# Patient Record
Sex: Female | Born: 1965 | Race: White | Hispanic: Refuse to answer | Marital: Married | Smoking: Never smoker
Health system: Southern US, Community
[De-identification: ages and names within clinical notes are randomized; demographics above are authoritative.]

## PROBLEM LIST (undated history)

## (undated) DIAGNOSIS — E039 Hypothyroidism, unspecified: Secondary | ICD-10-CM

## (undated) DIAGNOSIS — Z8619 Personal history of other infectious and parasitic diseases: Secondary | ICD-10-CM

## (undated) DIAGNOSIS — M5030 Other cervical disc degeneration, unspecified cervical region: Principal | ICD-10-CM

## (undated) HISTORY — DX: Personal history of other infectious and parasitic diseases: Z86.19

## (undated) HISTORY — DX: Other cervical disc degeneration, unspecified cervical region: M50.30

---

## 1987-05-23 HISTORY — PX: TEMPOROMANDIBULAR JOINT SURGERY: SHX35

## 1997-08-26 ENCOUNTER — Other Ambulatory Visit: Admission: RE | Admit: 1997-08-26 | Discharge: 1997-08-26 | Payer: Self-pay | Admitting: Obstetrics and Gynecology

## 1997-10-14 ENCOUNTER — Ambulatory Visit (HOSPITAL_COMMUNITY): Admission: RE | Admit: 1997-10-14 | Discharge: 1997-10-14 | Payer: Self-pay | Admitting: Obstetrics and Gynecology

## 1997-10-16 ENCOUNTER — Encounter: Admission: RE | Admit: 1997-10-16 | Discharge: 1998-01-14 | Payer: Self-pay | Admitting: Obstetrics & Gynecology

## 1998-01-17 ENCOUNTER — Inpatient Hospital Stay (HOSPITAL_COMMUNITY): Admission: AD | Admit: 1998-01-17 | Discharge: 1998-01-19 | Payer: Self-pay | Admitting: Obstetrics and Gynecology

## 1998-02-18 ENCOUNTER — Other Ambulatory Visit: Admission: RE | Admit: 1998-02-18 | Discharge: 1998-02-18 | Payer: Self-pay | Admitting: Obstetrics & Gynecology

## 1999-06-08 ENCOUNTER — Other Ambulatory Visit: Admission: RE | Admit: 1999-06-08 | Discharge: 1999-06-08 | Payer: Self-pay | Admitting: Obstetrics & Gynecology

## 1999-06-11 ENCOUNTER — Emergency Department (HOSPITAL_COMMUNITY): Admission: EM | Admit: 1999-06-11 | Discharge: 1999-06-11 | Payer: Self-pay | Admitting: Emergency Medicine

## 2000-11-15 ENCOUNTER — Other Ambulatory Visit: Admission: RE | Admit: 2000-11-15 | Discharge: 2000-11-15 | Payer: Self-pay | Admitting: Obstetrics & Gynecology

## 2001-11-27 ENCOUNTER — Other Ambulatory Visit: Admission: RE | Admit: 2001-11-27 | Discharge: 2001-11-27 | Payer: Self-pay | Admitting: Obstetrics & Gynecology

## 2002-08-14 ENCOUNTER — Encounter: Payer: Self-pay | Admitting: Obstetrics & Gynecology

## 2002-08-14 ENCOUNTER — Encounter: Admission: RE | Admit: 2002-08-14 | Discharge: 2002-08-14 | Payer: Self-pay | Admitting: Obstetrics & Gynecology

## 2002-12-02 ENCOUNTER — Other Ambulatory Visit: Admission: RE | Admit: 2002-12-02 | Discharge: 2002-12-02 | Payer: Self-pay | Admitting: Obstetrics & Gynecology

## 2004-02-15 ENCOUNTER — Other Ambulatory Visit: Admission: RE | Admit: 2004-02-15 | Discharge: 2004-02-15 | Payer: Self-pay | Admitting: Obstetrics & Gynecology

## 2005-03-22 ENCOUNTER — Other Ambulatory Visit: Admission: RE | Admit: 2005-03-22 | Discharge: 2005-03-22 | Payer: Self-pay | Admitting: Obstetrics & Gynecology

## 2005-06-18 ENCOUNTER — Emergency Department: Payer: Self-pay | Admitting: Emergency Medicine

## 2008-01-19 ENCOUNTER — Emergency Department: Payer: Self-pay | Admitting: Internal Medicine

## 2008-08-04 ENCOUNTER — Encounter: Admission: RE | Admit: 2008-08-04 | Discharge: 2008-08-04 | Payer: Self-pay | Admitting: Sports Medicine

## 2008-08-26 ENCOUNTER — Encounter: Admission: RE | Admit: 2008-08-26 | Discharge: 2008-08-26 | Payer: Self-pay | Admitting: Sports Medicine

## 2009-12-31 LAB — CBC AND DIFFERENTIAL
HCT: 44 % (ref 36–46)
Hemoglobin: 15.7 g/dL (ref 12.0–16.0)
Platelets: 202 10*3/uL (ref 150–399)
WBC: 4.2 10^3/mL

## 2009-12-31 LAB — BASIC METABOLIC PANEL
BUN: 10 mg/dL (ref 4–21)
Creatinine: 0.8 mg/dL (ref 0.5–1.1)
GLUCOSE: 90 mg/dL
POTASSIUM: 4.7 mmol/L (ref 3.4–5.3)
Sodium: 141 mmol/L (ref 137–147)

## 2009-12-31 LAB — HEPATIC FUNCTION PANEL
ALT: 11 U/L (ref 7–35)
AST: 19 U/L (ref 13–35)

## 2010-01-03 ENCOUNTER — Ambulatory Visit: Payer: Self-pay | Admitting: Family Medicine

## 2010-06-10 ENCOUNTER — Encounter
Admission: RE | Admit: 2010-06-10 | Discharge: 2010-06-10 | Payer: Self-pay | Source: Home / Self Care | Attending: Specialist | Admitting: Specialist

## 2013-07-16 ENCOUNTER — Emergency Department: Payer: Self-pay | Admitting: Emergency Medicine

## 2013-07-16 LAB — COMPREHENSIVE METABOLIC PANEL
ALK PHOS: 64 U/L
ALT: 21 U/L (ref 12–78)
Albumin: 4.4 g/dL (ref 3.4–5.0)
Anion Gap: 5 — ABNORMAL LOW (ref 7–16)
BILIRUBIN TOTAL: 0.8 mg/dL (ref 0.2–1.0)
BUN: 12 mg/dL (ref 7–18)
CALCIUM: 9.1 mg/dL (ref 8.5–10.1)
Chloride: 105 mmol/L (ref 98–107)
Co2: 26 mmol/L (ref 21–32)
Creatinine: 0.96 mg/dL (ref 0.60–1.30)
EGFR (African American): >60
Glucose: 105 mg/dL — ABNORMAL HIGH (ref 65–99)
OSMOLALITY: 272 (ref 275–301)
Potassium: 4 mmol/L (ref 3.5–5.1)
SGOT(AST): 26 U/L (ref 15–37)
Sodium: 136 mmol/L (ref 136–145)
Total Protein: 7.8 g/dL (ref 6.4–8.2)

## 2013-07-16 LAB — CBC WITH DIFFERENTIAL/PLATELET
Basophil #: 0 10*3/uL (ref 0.0–0.1)
Basophil %: 0.4 %
Eosinophil #: 0 10*3/uL (ref 0.0–0.7)
Eosinophil %: 0.1 %
HCT: 47.3 % — ABNORMAL HIGH (ref 35.0–47.0)
HGB: 16.3 g/dL — ABNORMAL HIGH (ref 12.0–16.0)
LYMPHS PCT: 2.8 %
Lymphocyte #: 0.2 10*3/uL — ABNORMAL LOW (ref 1.0–3.6)
MCH: 33.3 pg (ref 26.0–34.0)
MCHC: 34.5 g/dL (ref 32.0–36.0)
MCV: 96 fL (ref 80–100)
Monocyte #: 0.3 x10 3/mm (ref 0.2–0.9)
Monocyte %: 3.2 %
Neutrophil #: 8.2 10*3/uL — ABNORMAL HIGH (ref 1.4–6.5)
Neutrophil %: 93.5 %
Platelet: 146 10*3/uL — ABNORMAL LOW (ref 150–440)
RBC: 4.91 10*6/uL (ref 3.80–5.20)
RDW: 12 % (ref 11.5–14.5)
WBC: 8.8 10*3/uL (ref 3.6–11.0)

## 2014-01-20 DIAGNOSIS — M5030 Other cervical disc degeneration, unspecified cervical region: Secondary | ICD-10-CM

## 2014-01-20 HISTORY — DX: Other cervical disc degeneration, unspecified cervical region: M50.30

## 2014-02-16 ENCOUNTER — Ambulatory Visit: Payer: Self-pay | Admitting: Family Medicine

## 2014-02-17 ENCOUNTER — Ambulatory Visit
Admission: RE | Admit: 2014-02-17 | Discharge: 2014-02-17 | Disposition: A | Discharge status: Home / Self Care | Payer: BC Managed Care – PPO | Source: Ambulatory Visit | Attending: Family Medicine | Admitting: Family Medicine

## 2014-02-17 ENCOUNTER — Other Ambulatory Visit: Payer: Self-pay | Admitting: Family Medicine

## 2014-02-17 DIAGNOSIS — R29898 Other symptoms and signs involving the musculoskeletal system: Secondary | ICD-10-CM

## 2014-02-17 DIAGNOSIS — M542 Cervicalgia: Secondary | ICD-10-CM

## 2014-10-30 ENCOUNTER — Ambulatory Visit (INDEPENDENT_AMBULATORY_CARE_PROVIDER_SITE_OTHER): Payer: BC Managed Care – PPO | Admitting: Family Medicine

## 2014-10-30 ENCOUNTER — Encounter: Payer: Self-pay | Admitting: Family Medicine

## 2014-10-30 VITALS — BP 116/80 | HR 71 | Temp 99.0°F | Resp 16 | Ht 65.5 in | Wt 155.0 lb

## 2014-10-30 DIAGNOSIS — M542 Cervicalgia: Secondary | ICD-10-CM

## 2014-10-30 DIAGNOSIS — M5030 Other cervical disc degeneration, unspecified cervical region: Secondary | ICD-10-CM | POA: Insufficient documentation

## 2014-10-30 DIAGNOSIS — R202 Paresthesia of skin: Secondary | ICD-10-CM | POA: Insufficient documentation

## 2014-10-30 MED ORDER — PREDNISONE 10 MG PO TABS: ORAL_TABLET | ORAL | Status: AC

## 2014-10-30 NOTE — Progress Notes (Signed)
   Patient: Brittany Velazquez Female    DOB: 04/30/1966   49 y.o.   MRN: [id number] Visit Date: 10/30/2014  Today's Provider: Mila Merry, MD   Chief Complaint  Patient presents with  . Neck Pain   Subjective:    Neck Pain  This is a recurrent problem. The current episode started yesterday. The problem occurs constantly. The problem has been rapidly improving. The pain is present in the midline. The quality of the pain is described as aching. The pain is at a severity of 5/10. The pain is moderate. The symptoms are aggravated by bending (turning head side to side). Associated symptoms include headaches, numbness (in fingers on right hand), photophobia, tingling and weakness (in right arm). Pertinent negatives include no chest pain, fever, leg pain, pain with swallowing, syncope, trouble swallowing or visual change. Treatments tried: has been taking Prednisone that was prescribed seveal months ago. The treatment provided mild relief.    Patient states she has a history of ruptured disc in her neck which responded very well to prednisone in the past. She did see Dr. Wynetta Emery last year after similar episode and states no further intervention was recommended since she improved so much with prednisone. She states she took 12 prednisone pills yesterday feels much better today, and denies any difficulty with feeling nervous, jittery, or any trouble with her heart racing.   Previous Medications   OMEPRAZOLE (PRILOSEC) 20 MG CAPSULE    Take 20 mg by mouth daily.    Review of Systems  Constitutional: Negative for fever.  HENT: Negative for trouble swallowing.   Eyes: Positive for photophobia.  Cardiovascular: Negative for chest pain and syncope.  Musculoskeletal: Positive for neck pain.  Neurological: Positive for tingling, weakness (in right arm), numbness (in fingers on right hand) and headaches.    History  Substance Use Topics  . Smoking status: Never Smoker   . Smokeless tobacco: Not on file  .  Alcohol Use: No   Objective:   BP 116/80 mmHg  Pulse 71  Temp(Src) 99 F (37.2 C) (Oral)  Resp 16  Ht 5' 5.5" (1.664 m)  Wt 155 lb (70.308 kg)  BMI 25.39 kg/m2  SpO2 97%  Physical Exam   General Appearance:    Alert, cooperative, no distress  Eyes:    PERRL, conjunctiva/corneas clear, EOM's intact       Lungs:     Clear to auscultation bilaterally, respirations unlabored  Heart:    Regular rate and rhythm  Neurologic:   Awake, alert, oriented x 3. No apparent focal neurological           defect.   MS:   Minimal tenderness over mid c-spine. Normal strength and sensation of both upper extremities. Lateral head rotation limited to about 20 degrees to the left and about 45 degrees up due to pain.         Assessment & Plan:     1. Neck pain Known cervical disk disease. Responded well to prednisone in the past and will have her taper for the next 12 days. Call if not quickly improving.  - predniSONE (DELTASONE) 10 MG tablet; 6 tablets for 2 days, then 5 for 2 days, then 4 for 2 days, then 3 for 2 days, then 2 for 2 days, then 1 for 2 days.  Dispense: 42 tablet; Refill: 1   Follow up: No Follow-up on file.

## 2014-11-05 ENCOUNTER — Encounter: Payer: Self-pay | Admitting: Family Medicine

## 2015-10-12 ENCOUNTER — Encounter: Payer: Self-pay | Admitting: Family Medicine

## 2015-10-12 ENCOUNTER — Ambulatory Visit
Admission: RE | Admit: 2015-10-12 | Discharge: 2015-10-12 | Disposition: A | Discharge status: Home / Self Care | Payer: BC Managed Care – PPO | Source: Ambulatory Visit | Attending: Family Medicine | Admitting: Family Medicine

## 2015-10-12 ENCOUNTER — Ambulatory Visit (INDEPENDENT_AMBULATORY_CARE_PROVIDER_SITE_OTHER): Payer: BC Managed Care – PPO | Admitting: Family Medicine

## 2015-10-12 VITALS — BP 112/78 | HR 66 | Resp 18 | Wt 155.0 lb

## 2015-10-12 DIAGNOSIS — M5441 Lumbago with sciatica, right side: Secondary | ICD-10-CM | POA: Diagnosis present

## 2015-10-12 MED ORDER — METHOCARBAMOL 500 MG PO TABS: 500.0000 mg | ORAL_TABLET | Freq: Four times a day (QID) | ORAL | Status: DC

## 2015-10-12 MED ORDER — PREDNISONE 5 MG PO TABS: 5.0000 mg | ORAL_TABLET | Freq: Every day | ORAL | Status: DC

## 2015-10-12 NOTE — Progress Notes (Signed)
Patient ID: Brittany Velazquez, female   DOB: 1965-09-22, 50 y.o.   MRN: [id number]       Patient: Brittany Velazquez Female    DOB: 1966-01-25   50 y.o.   MRN: [id number] Visit Date: 10/12/2015  Today's Provider: Dortha Kern, PA   Chief Complaint  Patient presents with  . Back Pain   Subjective:    HPI  Patient started to have lumbar pain-disk issue on Sunday May 21st. This occur after she stepped over a fence and then rode a horse. Pain was very severe yesterday morning, she has hard time laying down, sitting down or walking. Standing is the most comfortable position. Patient is having pain radiating to the right leg also. She states that she has reccurring pain in disk area in between shoulder blades usually, she responds to Prednisone well and was in last time for this in June 2016 and saw Dr. Sherrie Mustache. She has had MRI cervical spine and xray in 2015.   Past Medical History  Diagnosis Date  . Annular tear of cervical disc 01/2014    Referred to Dr. Wynetta Emery 03/03/2014  . History of chicken pox   . History of mumps    Past Surgical History  Procedure Laterality Date  . Temporomandibular joint surgery  1989   Family History  Problem Relation Age of Onset  . Prostate cancer Father   . Breast cancer Sister    Allergies  Allergen Reactions  . Fentanyl Shortness Of Breath  . Codeine Rash   Previous Medications   LEVOTHYROXINE (SYNTHROID, LEVOTHROID) 50 MCG TABLET    Take 50 mcg by mouth daily.    Review of Systems  Constitutional: Negative.   Respiratory: Negative.   Cardiovascular: Negative.   Musculoskeletal: Positive for back pain, arthralgias and gait problem.    Social History  Substance Use Topics  . Smoking status: Never Smoker   . Smokeless tobacco: Never Used  . Alcohol Use: No   Objective:   BP 112/78 mmHg  Pulse 66  Resp 18  Wt 155 lb (70.308 kg)  Physical Exam  Constitutional: She is oriented to person, place, and time. She appears well-developed and  well-nourished. No distress.  HENT:  Head: Normocephalic and atraumatic.  Right Ear: Hearing normal.  Left Ear: Hearing normal.  Nose: Nose normal.  Eyes: Conjunctivae and lids are normal. Right eye exhibits no discharge. Left eye exhibits no discharge. No scleral icterus.  Cardiovascular: Normal rate and regular rhythm.   Pulmonary/Chest: Effort normal and breath sounds normal. No respiratory distress.  Abdominal: Soft. Bowel sounds are normal.  Musculoskeletal: She exhibits tenderness.  Pain to palpate dorsal spine of 1 or 2 lower lumbar vertebrae. Decreased strength in right leg to extend or dorsiflex toes/ankle. Right SLR's 80 degrees with pain in the right lower back.- no pain to test left leg.  Neurological: She is alert and oriented to person, place, and time.  DTR's symmetric knee and ankle jerk.  Skin: Skin is intact. No lesion and no rash noted.  Psychiatric: She has a normal mood and affect. Her speech is normal and behavior is normal. Thought content normal.      Assessment & Plan:     1. Midline low back pain with right-sided sciatica Some weakness in the right leg with sharp pains in lower lumbar vertebrae with certain movements (up to 8.5/10 pain level). Less pain standing. No numbness today. No known injury. Stepped over a 2 foot fence (twisting) and pain started 2  days ago. Given Prednisone for inflammation and Robaxin for pain and spasm. Will get x-ray evaluation (LMP 1 week ago - husband had a vasectomy). Home to rest and use ice pack or moist heat. Recheck pending x-ray reports. May need physical therapy and/or MRI scan. - predniSONE (DELTASONE) 5 MG tablet; Take 1 tablet (5 mg total) by mouth daily with breakfast. Taper by one tablet daily until all taken (6,5,4,3,2,1).  Dispense: 21 tablet; Refill: 0 - methocarbamol (ROBAXIN) 500 MG tablet; Take 1 tablet (500 mg total) by mouth 4 (four) times daily.  Dispense: 40 tablet; Refill: 0 - DG Lumbar Spine Complete        Dortha Kernennis Chrismon, PA  [facility]

## 2015-10-12 NOTE — Patient Instructions (Signed)

## 2015-11-16 LAB — HM PAP SMEAR

## 2015-11-30 ENCOUNTER — Encounter: Payer: Self-pay | Admitting: Family Medicine

## 2016-04-06 ENCOUNTER — Encounter: Payer: Self-pay | Admitting: Family Medicine

## 2016-04-06 ENCOUNTER — Ambulatory Visit (INDEPENDENT_AMBULATORY_CARE_PROVIDER_SITE_OTHER): Payer: BC Managed Care – PPO | Admitting: Family Medicine

## 2016-04-06 VITALS — BP 104/70 | HR 61 | Temp 98.4°F | Resp 16 | Wt 155.0 lb

## 2016-04-06 DIAGNOSIS — M5441 Lumbago with sciatica, right side: Secondary | ICD-10-CM | POA: Diagnosis not present

## 2016-04-06 DIAGNOSIS — R590 Localized enlarged lymph nodes: Secondary | ICD-10-CM | POA: Diagnosis not present

## 2016-04-06 DIAGNOSIS — R110 Nausea: Secondary | ICD-10-CM

## 2016-04-06 MED ORDER — PREDNISONE 5 MG PO TABS: ORAL_TABLET | ORAL | 0 refills | Status: DC

## 2016-04-06 MED ORDER — ONDANSETRON 4 MG PO TBDP: 4.0000 mg | ORAL_TABLET | Freq: Three times a day (TID) | ORAL | 0 refills | Status: DC | PRN

## 2016-04-06 MED ORDER — DOXYCYCLINE HYCLATE 100 MG PO TABS: 100.0000 mg | ORAL_TABLET | Freq: Two times a day (BID) | ORAL | 0 refills | Status: AC

## 2016-04-06 NOTE — Progress Notes (Signed)
104/70       Patient: Brittany Velazquez Female    DOB: 11/20/1965   50 y.o.   MRN: [id number] Visit Date: 04/06/2016  Today's Provider: Mila Merryonald Fisher, MD   Chief Complaint  Patient presents with  . Adenopathy   Subjective:    Patient noticed 2 nodes under right arm 3 days ago 04/03/16. Patient stated that she has had cold symptoms last week. Sore throat, congestion, sinus pressure and simus drainage. Node are swollen and red.       Allergies  Allergen Reactions  . Fentanyl Shortness Of Breath  . Codeine Rash     Current Outpatient Prescriptions:  .  levothyroxine (SYNTHROID, LEVOTHROID) 50 MCG tablet, Take 50 mcg by mouth daily., Disp: , Rfl: 4 .  predniSONE (DELTASONE) 5 MG tablet, Take 1 tablet (5 mg total) by mouth daily with breakfast. Taper by one tablet daily until all taken (6,5,4,3,2,1)., Disp: 21 tablet, Rfl: 0  Review of Systems  Constitutional: Negative for appetite change, chills, fatigue and fever.  HENT: Positive for congestion, postnasal drip, rhinorrhea and sinus pressure.   Respiratory: Negative for chest tightness and shortness of breath.   Cardiovascular: Negative for chest pain and palpitations.  Gastrointestinal: Negative for abdominal pain, nausea and vomiting.  Neurological: Positive for headaches. Negative for dizziness and weakness.    Social History  Substance Use Topics  . Smoking status: Never Smoker  . Smokeless tobacco: Never Used  . Alcohol use No   Objective:   BP 104/70 (BP Location: Left Arm, Patient Position: Sitting, Cuff Size: Normal)   Pulse 61   Temp 98.4 F (36.9 C) (Oral)   Resp 16   Wt 155 lb (70.3 kg)   LMP 03/20/2016   SpO2 97%   BMI 25.40 kg/m   Physical Exam   General Appearance:    Alert, cooperative, no distress  Eyes:    PERRL, conjunctiva/corneas clear, EOM's intact       Lungs:     Clear to auscultation bilaterally, respirations unlabored  Heart:    Regular rate and rhythm  Neurologic:   Awake, alert,  oriented x 3. No apparent focal neurological           defect.   Lymph:   Several small follicular lesion and very tender slightly swollen right axillary lymph nodes. No erythema.        Assessment & Plan:     1. Lymphadenopathy, axillary  - doxycycline (VIBRA-TABS) 100 MG tablet; Take 1 tablet (100 mg total) by mouth 2 (two) times daily.  Dispense: 20 tablet; Refill: 0 Follow up to recheck in 10 days unless symptoms have completely resovled.   2. Midline low back pain with right-sided sciatica, unspecified chronicity She states occasional course of prednisone prescribed by previous PCP was very effective and requests refill today.  - predniSONE (DELTASONE) 5 MG tablet; Taper by one tablet daily until all taken (6,5,4,3,2,1).  Dispense: 21 tablet; Refill: 0  3. Nausea Has occasional bouts of nausea and has been prescribed ondansetron in the past which she states worked well without adverse effects. Requests refill today.  - ondansetron (ZOFRAN ODT) 4 MG disintegrating tablet; Take 1 tablet (4 mg total) by mouth every 8 (eight) hours as needed for nausea or vomiting.  Dispense: 20 tablet; Refill: 0     The entirety of the information documented in the History of Present Illness, Review of Systems and Physical Exam were personally obtained by me. Portions of this information were initially  documented by April M. Sabra Heck, CMA and reviewed by me for thoroughness and accuracy.    Lelon Huh, MD  [facility]

## 2017-02-27 LAB — HEPATIC FUNCTION PANEL
ALT: 15 (ref 7–35)
AST: 25 (ref 13–35)
Alkaline Phosphatase: 85 (ref 25–125)
Bilirubin, Total: 0.5

## 2017-02-27 LAB — COMPREHENSIVE METABOLIC PANEL
ALBUMIN: 4.7
BILIRUBIN TOTAL: 0.5
CALCIUM: 9.5
Carbon Dioxide, Total: 21
Chloride: 105
TOTAL PROTEIN: 7.3 g/dL

## 2017-02-27 LAB — HEMOGLOBIN A1C: Hemoglobin A1C: 5

## 2017-02-27 LAB — BASIC METABOLIC PANEL
BUN: 13 (ref 4–21)
Creatinine: 0.9 (ref 0.5–1.1)
GLUCOSE: 87
Sodium: 143 (ref 137–147)

## 2017-02-27 LAB — LIPID PANEL
Cholesterol: 216 — AB (ref 0–200)
HDL: 58 (ref 35–70)
LDL Cholesterol: 136
Triglycerides: 108 (ref 40–160)

## 2017-02-27 LAB — CBC AND DIFFERENTIAL
HCT: 41 (ref 36–46)
Hemoglobin: 14.8 (ref 12.0–16.0)
PLATELETS: 189 (ref 150–399)
WBC: 4.9

## 2017-02-27 LAB — TSH: TSH: 1.63 (ref 0.41–5.90)

## 2017-02-27 LAB — CBC: RBC: 4.44

## 2017-03-05 ENCOUNTER — Encounter: Payer: Self-pay | Admitting: *Deleted

## 2017-03-07 ENCOUNTER — Encounter: Payer: Self-pay | Admitting: Family Medicine

## 2017-03-07 ENCOUNTER — Ambulatory Visit (INDEPENDENT_AMBULATORY_CARE_PROVIDER_SITE_OTHER): Payer: BC Managed Care – PPO | Admitting: Family Medicine

## 2017-03-07 VITALS — BP 92/66 | HR 76 | Temp 98.1°F | Resp 16 | Wt 149.0 lb

## 2017-03-07 DIAGNOSIS — R0981 Nasal congestion: Secondary | ICD-10-CM

## 2017-03-07 DIAGNOSIS — H8110 Benign paroxysmal vertigo, unspecified ear: Secondary | ICD-10-CM

## 2017-03-07 DIAGNOSIS — Z23 Encounter for immunization: Secondary | ICD-10-CM

## 2017-03-07 MED ORDER — MECLIZINE HCL 32 MG PO TABS: 32.0000 mg | ORAL_TABLET | Freq: Three times a day (TID) | ORAL | 0 refills | Status: DC | PRN

## 2017-03-07 MED ORDER — FLUTICASONE PROPIONATE 50 MCG/ACT NA SUSP: 2.0000 | Freq: Every day | NASAL | 6 refills | Status: DC

## 2017-03-07 MED ORDER — MECLIZINE HCL 25 MG PO TABS
25.0000 mg | ORAL_TABLET | Freq: Three times a day (TID) | ORAL | 0 refills | Status: DC | PRN
Start: 2017-03-07 — End: 2017-06-11

## 2017-03-07 NOTE — Progress Notes (Signed)
Patient: Brittany Velazquez Female    DOB: 12/10/1965   51 y.o.   MRN: [id number] Visit Date: 03/07/2017  Today's Provider: Shirlee LatchAngela Bacigalupo, MD   Chief Complaint  Patient presents with  . Dizziness   Subjective:    Dizziness  This is a new problem. Episode onset: x 3 days. The problem has been gradually worsening. Associated symptoms include anorexia, congestion, headaches (and facial pain), nausea (improved with Zofran), neck pain, vertigo and vomiting (improved with Zofran). Pertinent negatives include no abdominal pain, arthralgias, change in bowel habit, chest pain, chills, coughing, diaphoresis, fatigue, fever, myalgias, numbness, sore throat, swollen glands, urinary symptoms, visual change or weakness. Exacerbated by: any movement; even moving eyes exacerbates room spinning sensation. Treatments tried: Zofran, dramamine. Improvement on treatment: improves N/V, but not dizziness.  Pt states she had a cold last week, and feels as if the sinus issues is causing vertigo. She also states her brother was just diagnosed with MS, and had similar sx including neck pain and vertigo. Pt states she mentioned this to her GYN last week, who referred pt to a neurologist for further evaluation.  They have called to schedule an appt already.  Worse with looking upward and laying down.  Feels pulled to the right side.  States congestion is not similar to previous sinus infections.  Present for 6 days.  No cough.  HA, runny nose, nasal congestion, dizziness associated with N/V.  No acute visual changes.      Allergies  Allergen Reactions  . Fentanyl Shortness Of Breath  . Codeine Rash     Current Outpatient Prescriptions:  .  levothyroxine (SYNTHROID, LEVOTHROID) 50 MCG tablet, Take 50 mcg by mouth daily., Disp: , Rfl: 4 .  fluticasone (FLONASE) 50 MCG/ACT nasal spray, Place 2 sprays into both nostrils daily., Disp: 16 g, Rfl: 6 .  meclizine (ANTIVERT) 25 MG tablet, Take 1 tablet (25 mg  total) by mouth 3 (three) times daily as needed for dizziness., Disp: 90 tablet, Rfl: 0 .  ondansetron (ZOFRAN ODT) 4 MG disintegrating tablet, Take 1 tablet (4 mg total) by mouth every 8 (eight) hours as needed for nausea or vomiting. (Patient not taking: Reported on 03/07/2017), Disp: 20 tablet, Rfl: 0  Review of Systems  Constitutional: Negative for chills, diaphoresis, fatigue and fever.  HENT: Positive for congestion. Negative for sore throat.   Respiratory: Negative for cough.   Cardiovascular: Negative for chest pain.  Gastrointestinal: Positive for anorexia, nausea (improved with Zofran) and vomiting (improved with Zofran). Negative for abdominal pain and change in bowel habit.  Musculoskeletal: Positive for neck pain. Negative for arthralgias and myalgias.  Neurological: Positive for dizziness, vertigo and headaches (and facial pain). Negative for weakness and numbness.    Social History  Substance Use Topics  . Smoking status: Never Smoker  . Smokeless tobacco: Never Used  . Alcohol use No   Objective:   BP 92/66 (BP Location: Left Arm, Patient Position: Sitting, Cuff Size: Normal)   Pulse 76   Temp 98.1 F (36.7 C) (Oral)   Resp 16   Wt 149 lb (67.6 kg)   BMI 24.42 kg/m  Vitals:   03/07/17 0831  BP: 92/66  Pulse: 76  Resp: 16  Temp: 98.1 F (36.7 C)  TempSrc: Oral  Weight: 149 lb (67.6 kg)     Physical Exam  Constitutional: She is oriented to person, place, and time. She appears well-developed and well-nourished. No distress.  HENT:  Head:  Normocephalic and atraumatic.  Right Ear: Hearing, tympanic membrane, external ear and ear canal normal.  Left Ear: Hearing, tympanic membrane, external ear and ear canal normal.  Nose: Nose normal. Right sinus exhibits no maxillary sinus tenderness and no frontal sinus tenderness. Left sinus exhibits no maxillary sinus tenderness and no frontal sinus tenderness.  Mouth/Throat: Uvula is midline, oropharynx is clear and  moist and mucous membranes are normal.  Eyes: Pupils are equal, round, and reactive to light. Conjunctivae are normal. No scleral icterus. Right eye exhibits nystagmus. Right eye exhibits normal extraocular motion. Left eye exhibits nystagmus. Left eye exhibits normal extraocular motion.  Neck: Neck supple. No thyromegaly present.  Cardiovascular: Normal rate, regular rhythm and normal heart sounds.   No murmur heard. Pulmonary/Chest: Effort normal and breath sounds normal. No respiratory distress. She has no wheezes. She has no rales.  Abdominal: Soft. She exhibits no distension. There is no tenderness.  Musculoskeletal: She exhibits no edema or deformity.  Lymphadenopathy:    She has no cervical adenopathy.  Neurological: She is alert and oriented to person, place, and time. She has normal strength and normal reflexes. She displays no tremor. No cranial nerve deficit or sensory deficit. She exhibits normal muscle tone.  RAM intact, FNF intact, negative pronator drift, slow, calculated gait  Skin: Skin is warm and dry. No rash noted.  Psychiatric: She has a normal mood and affect. Her behavior is normal.  Vitals reviewed.      Assessment & Plan:     1. Benign paroxysmal positional vertigo, unspecified laterality - likely related to sinus congestion, neuro intact - trial of meclizine - no indication for imaging at this time - discussed return precautions - including change in neuro status, persistent vertigo not helped by meclizine, etc  2. Sinus congestion - chronic intermittent problem for patient - trial of flonase - this may be worsening BPPV  3. Need for immunization against influenza - Flu Vaccine QUAD 36+ mos IM   Meds ordered this encounter  Medications  . DISCONTD: meclizine (ANTIVERT) 32 MG tablet    Sig: Take 1 tablet (32 mg total) by mouth 3 (three) times daily as needed.    Dispense:  90 tablet    Refill:  0  . fluticasone (FLONASE) 50 MCG/ACT nasal spray    Sig:  Place 2 sprays into both nostrils daily.    Dispense:  16 g    Refill:  6  . meclizine (ANTIVERT) 25 MG tablet    Sig: Take 1 tablet (25 mg total) by mouth 3 (three) times daily as needed for dizziness.    Dispense:  90 tablet    Refill:  0    Order Specific Question:   Supervising Provider    Answer:   Malva Limes [[id number]]    Return if symptoms worsen or fail to improve.     The entirety of the information documented in the History of Present Illness, Review of Systems and Physical Exam were personally obtained by me. Portions of this information were initially documented by Irving Burton Ratchford, CMA and reviewed by me for thoroughness and accuracy.     Shirlee Latch, MD  [facility]

## 2017-03-07 NOTE — Patient Instructions (Signed)
Nystagmus - eye movement  Benign Positional Vertigo Vertigo is the feeling that you or your surroundings are moving when they are not. Benign positional vertigo is the most common form of vertigo. The cause of this condition is not serious (is benign). This condition is triggered by certain movements and positions (is positional). This condition can be dangerous if it occurs while you are doing something that could endanger you or others, such as driving. What are the causes? In many cases, the cause of this condition is not known. It may be caused by a disturbance in an area of the inner ear that helps your brain to sense movement and balance. This disturbance can be caused by a viral infection (labyrinthitis), head injury, or repetitive motion. What increases the risk? This condition is more likely to develop in:  Women.  People who are 52 years of age or older.  What are the signs or symptoms? Symptoms of this condition usually happen when you move your head or your eyes in different directions. Symptoms may start suddenly, and they usually last for less than a minute. Symptoms may include:  Loss of balance and falling.  Feeling like you are spinning or moving.  Feeling like your surroundings are spinning or moving.  Nausea and vomiting.  Blurred vision.  Dizziness.  Involuntary eye movement (nystagmus).  Symptoms can be mild and cause only slight annoyance, or they can be severe and interfere with daily life. Episodes of benign positional vertigo may return (recur) over time, and they may be triggered by certain movements. Symptoms may improve over time. How is this diagnosed? This condition is usually diagnosed by medical history and a physical exam of the head, neck, and ears. You may be referred to a health care provider who specializes in ear, nose, and throat (ENT) problems (otolaryngologist) or a provider who specializes in disorders of the nervous system (neurologist). You  may have additional testing, including:  MRI.  A CT scan.  Eye movement tests. Your health care provider may ask you to change positions quickly while he or she watches you for symptoms of benign positional vertigo, such as nystagmus. Eye movement may be tested with an electronystagmogram (ENG), caloric stimulation, the Dix-Hallpike test, or the roll test.  An electroencephalogram (EEG). This records electrical activity in your brain.  Hearing tests.  How is this treated? Usually, your health care provider will treat this by moving your head in specific positions to adjust your inner ear back to normal. Surgery may be needed in severe cases, but this is rare. In some cases, benign positional vertigo may resolve on its own in 2-4 weeks. Follow these instructions at home: Safety  Move slowly.Avoid sudden body or head movements.  Avoid driving.  Avoid operating heavy machinery.  Avoid doing any tasks that would be dangerous to you or others if a vertigo episode would occur.  If you have trouble walking or keeping your balance, try using a cane for stability. If you feel dizzy or unstable, sit down right away.  Return to your normal activities as told by your health care provider. Ask your health care provider what activities are safe for you. General instructions  Take over-the-counter and prescription medicines only as told by your health care provider.  Avoid certain positions or movements as told by your health care provider.  Drink enough fluid to keep your urine clear or pale yellow.  Keep all follow-up visits as told by your health care provider. This is  important. Contact a health care provider if:  You have a fever.  Your condition gets worse or you develop new symptoms.  Your family or friends notice any behavioral changes.  Your nausea or vomiting gets worse.  You have numbness or a "pins and needles" sensation. Get help right away if:  You have difficulty  speaking or moving.  You are always dizzy.  You faint.  You develop severe headaches.  You have weakness in your legs or arms.  You have changes in your hearing or vision.  You develop a stiff neck.  You develop sensitivity to light. This information is not intended to replace advice given to you by your health care provider. Make sure you discuss any questions you have with your health care provider. Document Released: 02/13/2006 Document Revised: 10/14/2015 Document Reviewed: 08/31/2014 Elsevier Interactive Patient Education  [employer]2018 [employer]

## 2017-03-08 LAB — FECAL OCCULT BLOOD, GUAIAC: Fecal Occult Blood: NEGATIVE

## 2017-03-09 ENCOUNTER — Ambulatory Visit (INDEPENDENT_AMBULATORY_CARE_PROVIDER_SITE_OTHER): Payer: BC Managed Care – PPO | Admitting: Family Medicine

## 2017-03-09 ENCOUNTER — Encounter: Payer: Self-pay | Admitting: Family Medicine

## 2017-03-09 VITALS — BP 118/70 | HR 64 | Temp 98.0°F | Resp 16

## 2017-03-09 DIAGNOSIS — H8113 Benign paroxysmal vertigo, bilateral: Secondary | ICD-10-CM

## 2017-03-09 NOTE — Progress Notes (Signed)
Patient: Brittany Velazquez Female    DOB: 1966/01/09   51 y.o.   MRN: [id number] Visit Date: 03/09/2017  Today's Provider: Shirlee Latch, MD   Chief Complaint  Patient presents with  . Dizziness   Subjective:    HPI  Patient comes in today c/o dizzy spells. She was seen in the offie 2 days ago, and she was prescribed meclizine and flonase to help with her symptoms. Patient reports that the Flonase has helped with her congestion somewhat, but she is still very dizzy. She reports that coming from a sitting to standing position, the room starts to spin. She also mentions that she vomited this morning due to the dizziness.   Dizziness has improved and room is no longer spinning when she is laying in bed.     Allergies  Allergen Reactions  . Fentanyl Shortness Of Breath  . Codeine Rash     Current Outpatient Prescriptions:  .  fluticasone (FLONASE) 50 MCG/ACT nasal spray, Place 2 sprays into both nostrils daily., Disp: 16 g, Rfl: 6 .  levothyroxine (SYNTHROID, LEVOTHROID) 50 MCG tablet, Take 50 mcg by mouth daily., Disp: , Rfl: 4 .  meclizine (ANTIVERT) 25 MG tablet, Take 1 tablet (25 mg total) by mouth 3 (three) times daily as needed for dizziness., Disp: 90 tablet, Rfl: 0 .  ondansetron (ZOFRAN ODT) 4 MG disintegrating tablet, Take 1 tablet (4 mg total) by mouth every 8 (eight) hours as needed for nausea or vomiting. (Patient not taking: Reported on 03/07/2017), Disp: 20 tablet, Rfl: 0  Review of Systems  Constitutional: Positive for activity change and fatigue. Negative for appetite change, chills and fever.  HENT: Positive for congestion, postnasal drip, rhinorrhea and sinus pressure. Negative for ear discharge, ear pain, trouble swallowing and voice change.   Respiratory: Negative.   Cardiovascular: Negative.   Gastrointestinal: Positive for nausea and vomiting. Negative for abdominal pain, constipation and diarrhea.  Genitourinary: Negative.   Skin: Negative.     Neurological: Positive for dizziness. Negative for syncope, speech difficulty, weakness, light-headedness, numbness and headaches.  Psychiatric/Behavioral: Negative.     Social History  Substance Use Topics  . Smoking status: Never Smoker  . Smokeless tobacco: Never Used  . Alcohol use No   Objective:   BP 118/70 (BP Location: Right Arm, Patient Position: Sitting, Cuff Size: Normal)   Pulse 64   Temp 98 F (36.7 C)   Resp 16  Vitals:   03/09/17 0826  BP: 118/70  Pulse: 64  Resp: 16  Temp: 98 F (36.7 C)     Physical Exam  Constitutional: She is oriented to person, place, and time. She appears well-developed and well-nourished. No distress.  HENT:  Head: Normocephalic and atraumatic.  Right Ear: Tympanic membrane, external ear and ear canal normal.  Left Ear: Tympanic membrane, external ear and ear canal normal.  Nose: Nose normal.  Mouth/Throat: Oropharynx is clear and moist.  Eyes: Pupils are equal, round, and reactive to light. Conjunctivae are normal. Right eye exhibits nystagmus. Left eye exhibits nystagmus.  Neck: Neck supple. No thyromegaly present.  Cardiovascular: Normal rate, regular rhythm, normal heart sounds and intact distal pulses.   No murmur heard. Pulmonary/Chest: Effort normal and breath sounds normal. No respiratory distress. She has no wheezes. She has no rales.  Abdominal: Soft. She exhibits no distension. There is no tenderness.  Musculoskeletal: She exhibits no edema or deformity.  Lymphadenopathy:    She has no cervical adenopathy.  Neurological: She  is alert and oriented to person, place, and time. No cranial nerve deficit.  +Dix Hallpike b/l (L>R)  Skin: Skin is warm and dry. No rash noted.  Psychiatric: She has a normal mood and affect. Her behavior is normal.  Vitals reviewed.      Assessment & Plan:     1. Benign paroxysmal positional vertigo due to bilateral vestibular disorder - Exam continues to be consistent with BPPV and  otherwise neuro intact -Continue meclizine -Discussed with patient the natural course of this -Return precautions discussed - Instructed patient on doing the Epley maneuver - Ambulatory referral to Physical Therapy      Return if symptoms worsen or fail to improve.  The entirety of the information documented in the History of Present Illness, Review of Systems and Physical Exam were personally obtained by me. Portions of this information were initially documented by Anson Oregonachelle Presley, CMA and reviewed by me for thoroughness and accuracy.    Shirlee LatchAngela Gwenivere Hiraldo, MD  [facility]

## 2017-03-09 NOTE — Patient Instructions (Signed)

## 2017-03-16 ENCOUNTER — Encounter: Payer: Self-pay | Admitting: Family Medicine

## 2017-03-16 ENCOUNTER — Ambulatory Visit: Payer: BC Managed Care – PPO | Admitting: Physical Therapy

## 2017-03-29 ENCOUNTER — Ambulatory Visit: Payer: BC Managed Care – PPO | Admitting: Family Medicine

## 2017-03-29 VITALS — BP 102/66 | HR 72 | Temp 98.4°F | Resp 16 | Wt 152.0 lb

## 2017-03-29 DIAGNOSIS — B373 Candidiasis of vulva and vagina: Secondary | ICD-10-CM | POA: Diagnosis not present

## 2017-03-29 DIAGNOSIS — R81 Glycosuria: Secondary | ICD-10-CM

## 2017-03-29 DIAGNOSIS — B3731 Acute candidiasis of vulva and vagina: Secondary | ICD-10-CM

## 2017-03-29 DIAGNOSIS — N390 Urinary tract infection, site not specified: Secondary | ICD-10-CM | POA: Diagnosis not present

## 2017-03-29 LAB — HM MAMMOGRAPHY

## 2017-03-29 LAB — POCT URINALYSIS DIPSTICK
Bilirubin, UA: NEGATIVE
GLUCOSE UA: 100
Ketones, UA: NEGATIVE
NITRITE UA: POSITIVE
PROTEIN UA: 100
SPEC GRAV UA: 1.02 (ref 1.010–1.025)
Urobilinogen, UA: 0.2 E.U./dL
pH, UA: 6 (ref 5.0–8.0)

## 2017-03-29 LAB — POCT GLYCOSYLATED HEMOGLOBIN (HGB A1C)
Est. average glucose Bld gHb Est-mCnc: 103
Hemoglobin A1C: 5.2

## 2017-03-29 MED ORDER — SULFAMETHOXAZOLE-TRIMETHOPRIM 800-160 MG PO TABS: 1.0000 | ORAL_TABLET | Freq: Two times a day (BID) | ORAL | 0 refills | Status: DC

## 2017-03-29 MED ORDER — FLUCONAZOLE 150 MG PO TABS: 150.0000 mg | ORAL_TABLET | Freq: Once | ORAL | 1 refills | Status: AC

## 2017-03-29 NOTE — Patient Instructions (Signed)
  Diet Recommendations for Diabetes   Starchy (carb) foods include: Bread, rice, pasta, potatoes, corn, crackers, bagels, muffins, all baked goods.  (Fruits, milk, and yogurt also have carbohydrate, but most of these foods will not spike your blood sugar as the starchy foods will.)  A few fruits do cause high blood sugars; use small portions of bananas (limit to 1/2 at a time), grapes, watermelon, and most tropical fruits.    Protein foods include: Meat, fish, poultry, eggs, dairy foods, and beans such as pinto and kidney beans (beans also provide carbohydrate).   1. Eat at least 3 meals and 1-2 snacks per day. Never go more than 4-5 hours while awake without eating. Eat breakfast within the first hour of getting up.   2. Limit starchy foods to TWO per meal and ONE per snack. ONE portion of a starchy  food is equal to the following:   - ONE slice of bread (or its equivalent, such as half of a hamburger bun).   - 1/2 cup of a "scoopable" starchy food such as potatoes or rice.   - 15 grams of carbohydrate as shown on food label.  3. Include at every meal: a protein food, a carb food, and vegetables and/or fruit.   - Obtain twice as many veg's as protein or carbohydrate foods for both lunch and dinner.   - Fresh or frozen veg's are best.   - Try to keep frozen veg's on hand for a quick vegetable serving.          Urinary Tract Infection, Adult A urinary tract infection (UTI) is an infection of any part of the urinary tract. The urinary tract includes the:  Kidneys.  Ureters.  Bladder.  Urethra.  These organs make, store, and get rid of pee (urine) in the body. Follow these instructions at home:  Take over-the-counter and prescription medicines only as told by your doctor.  If you were prescribed an antibiotic medicine, take it as told by your doctor. Do not stop taking the antibiotic even if you start to feel better.  Avoid the following  drinks: ? Alcohol. ? Caffeine. ? Tea. ? Carbonated drinks.  Drink enough fluid to keep your pee clear or pale yellow.  Keep all follow-up visits as told by your doctor. This is important.  Make sure to: ? Empty your bladder often and completely. Do not to hold pee for long periods of time. ? Empty your bladder before and after sex. ? Wipe from front to back after a bowel movement if you are female. Use each tissue one time when you wipe. Contact a doctor if:  You have back pain.  You have a fever.  You feel sick to your stomach (nauseous).  You throw up (vomit).  Your symptoms do not get better after 3 days.  Your symptoms go away and then come back. Get help right away if:  You have very bad back pain.  You have very bad lower belly (abdominal) pain.  You are throwing up and cannot keep down any medicines or water. This information is not intended to replace advice given to you by your health care provider. Make sure you discuss any questions you have with your health care provider. Document Released: 10/25/2007 Document Revised: 10/14/2015 Document Reviewed: 03/29/2015 Elsevier Interactive Patient Education  [employer]2018 Elsevier Inc.

## 2017-03-29 NOTE — Progress Notes (Signed)
Patient: Brittany Velazquez Female    DOB: 01/26/1966   51 y.o.   MRN: [id number] Visit Date: 03/29/2017  Today's Provider: Shirlee LatchAngela Chun Sellen, MD   Chief Complaint  Patient presents with  . Urinary Tract Infection   Subjective:    Urinary Tract Infection   This is a new problem. The current episode started today. Quality: sharp pain in abdomen. She is also c/o right sided back pain.  The pain is at a severity of 6/10. There has been no fever. She is sexually active. There is no history of pyelonephritis. Associated symptoms include a discharge (Some discharge on Saturday (after having sexual intercouse on Friday), but this has improved), flank pain, hesitancy, nausea and urgency. Pertinent negatives include no chills, frequency, hematuria, sweats or vomiting. She has tried nothing for the symptoms.  She is also c/o vaginal itching that has been present x 6 days. She states this is mild, and is similar to yeast infection.  States that she is followed by GYN for vaginal atrophy.  Tried premarin cream x1 day and stopped because she developed vaginal spotting.  She was told this would help with skin fragility and recurrent yeast infections.    Allergies  Allergen Reactions  . Fentanyl Shortness Of Breath  . Codeine Rash     Current Outpatient Medications:  .  fluticasone (FLONASE) 50 MCG/ACT nasal spray, Place 2 sprays into both nostrils daily., Disp: 16 g, Rfl: 6 .  levothyroxine (SYNTHROID, LEVOTHROID) 50 MCG tablet, Take 50 mcg by mouth daily., Disp: , Rfl: 4 .  meclizine (ANTIVERT) 25 MG tablet, Take 1 tablet (25 mg total) by mouth 3 (three) times daily as needed for dizziness. (Patient not taking: Reported on 03/29/2017), Disp: 90 tablet, Rfl: 0 .  ondansetron (ZOFRAN ODT) 4 MG disintegrating tablet, Take 1 tablet (4 mg total) by mouth every 8 (eight) hours as needed for nausea or vomiting. (Patient not taking: Reported on 03/07/2017), Disp: 20 tablet, Rfl: 0  Review of Systems    Constitutional: Negative for chills, fatigue and fever.  Respiratory: Negative.   Cardiovascular: Negative.   Gastrointestinal: Positive for nausea. Negative for vomiting.  Genitourinary: Positive for flank pain, hesitancy and urgency. Negative for frequency and hematuria.  Neurological: Negative.     Social History   Tobacco Use  . Smoking status: Never Smoker  . Smokeless tobacco: Never Used  Substance Use Topics  . Alcohol use: No    Alcohol/week: 0.0 oz   Objective:   BP 102/66 (BP Location: Left Arm, Patient Position: Sitting, Cuff Size: Normal)   Pulse 72   Temp 98.4 F (36.9 C) (Oral)   Resp 16   Wt 152 lb (68.9 kg)   BMI 24.91 kg/m  Vitals:   03/29/17 1345  BP: 102/66  Pulse: 72  Resp: 16  Temp: 98.4 F (36.9 C)  TempSrc: Oral  Weight: 152 lb (68.9 kg)     Physical Exam  Constitutional: She is oriented to person, place, and time. She appears well-developed and well-nourished. No distress.  HENT:  Head: Normocephalic and atraumatic.  Cardiovascular: Normal rate, regular rhythm, normal heart sounds and intact distal pulses.  No murmur heard. Pulmonary/Chest: Effort normal and breath sounds normal. No respiratory distress. She has no wheezes. She has no rales.  Abdominal: Soft. Bowel sounds are normal. She exhibits no distension. There is no tenderness. There is no rebound and no guarding.  No CVAT  Genitourinary:  Genitourinary Comments: deferred  Neurological:  She is alert and oriented to person, place, and time.  Skin: Skin is warm and dry. No rash noted.  Psychiatric: She has a normal mood and affect. Her behavior is normal.  Vitals reviewed.   Results for orders placed or performed in visit on 03/29/17  POCT urinalysis dipstick  Result Value Ref Range   Color, UA yellow    Clarity, UA cloudy    Glucose, UA 100    Bilirubin, UA Negative    Ketones, UA Negative    Spec Grav, UA 1.020 1.010 - 1.025   Blood, UA Large    pH, UA 6.0 5.0 - 8.0    Protein, UA 100    Urobilinogen, UA 0.2 0.2 or 1.0 E.U./dL   Nitrite, UA Positive    Leukocytes, UA Large (3+) (A) Negative  POCT glycosylated hemoglobin (Hb A1C)  Result Value Ref Range   Hemoglobin A1C 5.2    Est. average glucose Bld gHb Est-mCnc 103        Assessment & Plan:     1. Acute lower UTI - large leuks, positive nitrites on UA - POCT urinalysis dipstick - Urine Culture - Urinalysis, microscopic only - treat with 5d course of Bactrim - return precautions discussed  2. Glucose found in urine on examination - no diabetes history, A1c wnl - likely due to high sugar meal recently (pancakes with syrup) - POCT glycosylated hemoglobin (Hb A1C)  3. Yeast vaginitis - likely a recurrent yeast infection - treat with fluconazole after abx - discuss topical estrogen and treatment of atrophic vaginitis with OB/gyn   Meds ordered this encounter  Medications  . sulfamethoxazole-trimethoprim (BACTRIM DS,SEPTRA DS) 800-160 MG tablet    Sig: Take 1 tablet 2 (two) times daily by mouth.    Dispense:  10 tablet    Refill:  0  . fluconazole (DIFLUCAN) 150 MG tablet    Sig: Take 1 tablet (150 mg total) once for 1 dose by mouth. Can repeat in 3 days if symptoms persist    Dispense:  2 tablet    Refill:  1    Return if symptoms worsen or fail to improve.   The entirety of the information documented in the History of Present Illness, Review of Systems and Physical Exam were personally obtained by me. Portions of this information were initially documented by Irving BurtonEmily Ratchford, CMA and reviewed by me for thoroughness and accuracy.       Shirlee LatchAngela Qamar Rosman, MD  [facility]

## 2017-03-30 LAB — URINALYSIS, MICROSCOPIC ONLY
HYALINE CAST: NONE SEEN /LPF
Squamous Epithelial / LPF: NONE SEEN /HPF (ref <=5)
WBC, UA: >=60 /HPF — AB (ref 0–5)

## 2017-04-01 LAB — URINE CULTURE
MICRO NUMBER:: 81259887
SPECIMEN QUALITY:: ADEQUATE

## 2017-04-02 ENCOUNTER — Telehealth: Payer: Self-pay

## 2017-04-02 ENCOUNTER — Encounter: Payer: Self-pay | Admitting: *Deleted

## 2017-04-02 NOTE — Telephone Encounter (Signed)
-----   Message from Erasmo DownerAngela M Bacigalupo, MD sent at 04/02/2017  8:31 AM EST ----- Urine culture shows E coli UTI that is resistant to several antibiotics, but susceptible to Bactrim, which we treated with.  Patient should finish antibiotic course as prescribed.  Erasmo DownerBacigalupo, Angela M, MD, MPH [facility] 04/02/2017 8:31 AM

## 2017-04-02 NOTE — Telephone Encounter (Signed)
Pt advised and states she is feeling better.

## 2017-06-11 ENCOUNTER — Ambulatory Visit: Payer: BC Managed Care – PPO | Admitting: Family Medicine

## 2017-06-11 ENCOUNTER — Encounter: Payer: Self-pay | Admitting: Family Medicine

## 2017-06-11 VITALS — BP 116/76 | HR 63 | Temp 98.6°F | Resp 16 | Wt 152.0 lb

## 2017-06-11 DIAGNOSIS — G44209 Tension-type headache, unspecified, not intractable: Secondary | ICD-10-CM | POA: Diagnosis not present

## 2017-06-11 DIAGNOSIS — H109 Unspecified conjunctivitis: Secondary | ICD-10-CM | POA: Diagnosis not present

## 2017-06-11 MED ORDER — LEVOCETIRIZINE DIHYDROCHLORIDE 5 MG PO TABS: 5.0000 mg | ORAL_TABLET | Freq: Every evening | ORAL | 5 refills | Status: DC

## 2017-06-11 MED ORDER — POLYMYXIN B-TRIMETHOPRIM 10000-0.1 UNIT/ML-% OP SOLN: 1.0000 [drp] | OPHTHALMIC | 0 refills | Status: AC

## 2017-06-11 NOTE — Progress Notes (Signed)
Patient: Brittany Velazquez Female    DOB: 07/02/1965   52 y.o.   MRN: [id number] Visit Date: 06/11/2017  Today's Provider: Shirlee LatchAngela Bacigalupo, MD   I, Joslyn HyEmily Ratchford, CMA, am acting as scribe for Shirlee LatchAngela Bacigalupo, MD.  Chief Complaint  Patient presents with  . Headache  . Eye Problem   Subjective:    Headache   This is a new problem. Episode onset: x 1-2 months. The problem has been waxing and waning. The pain is located in the frontal (this is causing squinting and "muscle spasms of my forehead". This is causing frown lines.) region. The pain quality is not similar to prior headaches (pt has a H/O migraines, and this is not similar. Feels sinus related per pt.). The quality of the pain is described as squeezing. The pain is at a severity of 5/10. The pain is moderate. Associated symptoms include blurred vision (she saw her optometrist last week for this), coughing (dry), dizziness (vertigo), eye redness, eye watering, photophobia and rhinorrhea. Pertinent negatives include no abdominal pain, abnormal behavior, back pain, eye pain, fever, nausea or vomiting. The symptoms are aggravated by bright light. Treatments tried: Flonase, advil. The treatment provided mild relief.  Eye Problem   The left eye is affected. This is a new problem. The current episode started yesterday. There was no injury mechanism. The patient is experiencing no pain. 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 Associated symptoms include blurred vision (she saw her optometrist last week for this), an eye discharge, eye redness, photophobia and a recent URI. Pertinent negatives include no double vision, fever, foreign body sensation, itching, nausea or vomiting. She has tried nothing for the symptoms.       Allergies  Allergen Reactions  . Fentanyl Shortness Of Breath  . Codeine Rash     Current Outpatient Medications:  .  fluticasone (FLONASE) 50 MCG/ACT nasal  spray, Place 2 sprays into both nostrils daily., Disp: 16 g, Rfl: 6 .  levothyroxine (SYNTHROID, LEVOTHROID) 50 MCG tablet, Take 50 mcg by mouth daily., Disp: , Rfl: 4 .  levocetirizine (XYZAL) 5 MG tablet, Take 1 tablet (5 mg total) by mouth every evening., Disp: 30 tablet, Rfl: 5 .  trimethoprim-polymyxin b (POLYTRIM) ophthalmic solution, Place 1 drop into the left eye every 4 (four) hours for 7 days., Disp: 10 mL, Rfl: 0  Review of Systems  Constitutional: Negative for fever.  HENT: Positive for rhinorrhea.   Eyes: Positive for blurred vision (she saw her optometrist last week for this), photophobia, discharge and redness. Negative for double vision, pain and itching.  Respiratory: Positive for cough (dry).   Gastrointestinal: Negative for abdominal pain, nausea and vomiting.  Musculoskeletal: Negative for back pain.  Neurological: Positive for dizziness (vertigo) and headaches.    Social History   Tobacco Use  . Smoking status: Never Smoker  . Smokeless tobacco: Never Used  Substance Use Topics  . Alcohol use: No    Alcohol/week: 0.0 oz   Objective:   BP 116/76 (BP Location: Left Arm, Patient Position: Sitting, Cuff Size: Normal)   Pulse 63   Temp 98.6 F (37 C) (Oral)   Resp 16   Wt 152 lb (68.9 kg)   SpO2 99%   BMI 24.91 kg/m  Vitals:   06/11/17 0908  BP: 116/76  Pulse: 63  Resp: 16  Temp: 98.6 F (37 C)  TempSrc: Oral  SpO2: 99%  Weight:  152 lb (68.9 kg)     Physical Exam  Constitutional: She is oriented to person, place, and time. She appears well-developed and well-nourished. No distress.  HENT:  Head: Normocephalic and atraumatic.  Right Ear: External ear normal.  Left Ear: External ear normal.  Nose: Nose normal. Right sinus exhibits no maxillary sinus tenderness and no frontal sinus tenderness. Left sinus exhibits no maxillary sinus tenderness and no frontal sinus tenderness.  Mouth/Throat: Oropharynx is clear and moist. No oropharyngeal exudate.    TTP over b/l ethmoid sinuses  Eyes: EOM are normal. Pupils are equal, round, and reactive to light. Right eye exhibits no discharge. Left eye exhibits discharge. Right conjunctiva is not injected. Left conjunctiva is injected. No scleral icterus.  Neck: Neck supple. No thyromegaly present.  Cardiovascular: Normal rate, regular rhythm, normal heart sounds and intact distal pulses.  No murmur heard. Pulmonary/Chest: Effort normal and breath sounds normal. No respiratory distress. She has no wheezes. She has no rales.  Musculoskeletal: She exhibits no edema.  Lymphadenopathy:    She has no cervical adenopathy.  Neurological: She is alert and oriented to person, place, and time.  Skin: Skin is warm and dry. No rash noted.  Psychiatric: She has a normal mood and affect. Her behavior is normal.  Vitals reviewed.       Assessment & Plan:      1. Bacterial conjunctivitis of left eye - exam consistent with bacterial conjunctivitis - will treat with 7d course of Polytrim eye drops - vision intact and no red flags - return precautions discussed  2. Tension headache - tension headaches, unclear if squinting is the cause or a symptom - advised that botox can help with tension headaches reulting from squinting, as she is looking into this anyways - could also be related to ethmoid sinus pressure - recommend adding antihistamine to flonase and 1 wk course of decongestant to see if this will help - return precautions discussed    Meds ordered this encounter  Medications  . trimethoprim-polymyxin b (POLYTRIM) ophthalmic solution    Sig: Place 1 drop into the left eye every 4 (four) hours for 7 days.    Dispense:  10 mL    Refill:  0  . levocetirizine (XYZAL) 5 MG tablet    Sig: Take 1 tablet (5 mg total) by mouth every evening.    Dispense:  30 tablet    Refill:  5     Return if symptoms worsen or fail to improve.   The entirety of the information documented in the History of  Present Illness, Review of Systems and Physical Exam were personally obtained by me. Portions of this information were initially documented by Irving Burton Ratchford, CMA and reviewed by me for thoroughness and accuracy.    Erasmo Downer, MD, MPH [facility] 06/11/2017 10:12 AM

## 2017-06-11 NOTE — Patient Instructions (Signed)

## 2017-06-12 ENCOUNTER — Ambulatory Visit: Payer: BC Managed Care – PPO | Admitting: Family Medicine

## 2017-06-18 ENCOUNTER — Telehealth: Payer: Self-pay | Admitting: Family Medicine

## 2017-06-18 NOTE — Telephone Encounter (Signed)
I think it would be more appropriate for her to see Dr. B about this now. It looks like she has a few vaccine only visits and a same day available tomorrow.

## 2017-06-18 NOTE — Telephone Encounter (Signed)
Pt stated that Dr. Sherrie MustacheFisher has called her in predniSONE (DELTASONE) 10 MG tablet in the past for a rupture disc in her back. Pt stated that it is acting up and would like the Rx for predniSONE (DELTASONE) 10 MG tablet sent to [facility] [address] Pt stated that if she can't get the Rx without OV she would like to be seen tomorrow 06/19/17. Pt stated she can not come in the office today. If pt needs OV can pt have the sameday appt for 06/19/17? Please advise. Thanks TNP

## 2017-06-18 NOTE — Telephone Encounter (Signed)
Appointment made for 9:45 tomorrow.

## 2017-06-19 ENCOUNTER — Encounter: Payer: Self-pay | Admitting: Family Medicine

## 2017-06-19 ENCOUNTER — Ambulatory Visit: Payer: BC Managed Care – PPO | Admitting: Family Medicine

## 2017-06-19 VITALS — BP 128/82 | HR 62 | Temp 98.3°F | Resp 16 | Wt 151.0 lb

## 2017-06-19 DIAGNOSIS — M542 Cervicalgia: Secondary | ICD-10-CM | POA: Diagnosis not present

## 2017-06-19 DIAGNOSIS — M5412 Radiculopathy, cervical region: Secondary | ICD-10-CM

## 2017-06-19 MED ORDER — CYCLOBENZAPRINE HCL 5 MG PO TABS: 5.0000 mg | ORAL_TABLET | Freq: Three times a day (TID) | ORAL | 1 refills | Status: DC | PRN

## 2017-06-19 MED ORDER — PREDNISONE 50 MG PO TABS
50.0000 mg | ORAL_TABLET | Freq: Every day | ORAL | 0 refills | Status: DC
Start: 2017-06-19 — End: 2017-07-16

## 2017-06-19 NOTE — Assessment & Plan Note (Signed)
Chronic and intermittent problem Last MRI from 2015 reviewed and showed partial compression fromcervical disc at C5-6 Some muscle spasm is contributing to loss of range of motion and pain We'll treat this withFlexeril as needed Cervical radiculopathy also present with changes in neuro exam Plan for cervical radiculopathy detailed above Return precautions discussed

## 2017-06-19 NOTE — Assessment & Plan Note (Signed)
Concern for radiculopathy given paresthesias and hand weakness in the setting of previously known cervical disc herniation We will repeat cervical MRI without contrast Patient declines x-ray that may be needed for MRI approval at this time Referral to spine surgery to discuss further options Street with seven-day course of high-dose prednisone 50 mg daily with breakfast Return precautions discussed

## 2017-06-19 NOTE — Patient Instructions (Signed)
Cervical Radiculopathy  Cervical radiculopathy means that a nerve in the neck is pinched or bruised. This can cause pain or loss of feeling (numbness) that runs from your neck to your arm and fingers.  Follow these instructions at home:  Managing pain  ? Take over-the-counter and prescription medicines only as told by your doctor.  ? If directed, put ice on the injured or painful area.  ? Put ice in a plastic bag.  ? Place a towel between your skin and the bag.  ? Leave the ice on for 20 minutes, 2?3 times per day.  ? If ice does not help, you can try using heat. Take a warm shower or warm bath, or use a heat pack as told by your doctor.  ? You may try a gentle neck and shoulder massage.  Activity  ? Rest as needed. Follow instructions from your doctor about any activities to avoid.  ? Do exercises as told by your doctor or physical therapist.  General instructions  ? If you were given a soft collar, wear it as told by your doctor.  ? Use a flat pillow when you sleep.  ? Keep all follow-up visits as told by your doctor. This is important.  Contact a doctor if:  ? Your condition does not improve with treatment.  Get help right away if:  ? Your pain gets worse and is not controlled with medicine.  ? You lose feeling or feel weak in your hand, arm, face, or leg.  ? You have a fever.  ? You have a stiff neck.  ? You cannot control when you poop or pee (have incontinence).  ? You have trouble with walking, balance, or talking.  This information is not intended to replace advice given to you by your health care provider. Make sure you discuss any questions you have with your health care provider.  Document Released: 04/27/2011 Document Revised: 10/14/2015 Document Reviewed: 07/02/2014  Elsevier Interactive Patient Education ? 2018 Else[employer] Inc.
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
## 2017-06-21 ENCOUNTER — Telehealth: Payer: Self-pay | Admitting: Family Medicine

## 2017-06-21 DIAGNOSIS — M542 Cervicalgia: Secondary | ICD-10-CM

## 2017-06-21 NOTE — Telephone Encounter (Signed)
Pt would like to see Dr Marikay Alaravid Jones (neurosurgeon) instead of going to orthopaedic doctor for her neck pain

## 2017-06-21 NOTE — Telephone Encounter (Signed)
Please review

## 2017-06-22 NOTE — Telephone Encounter (Signed)
Order placed-Brittany Velazquez Brittany Velazquez, RMA  

## 2017-06-22 NOTE — Telephone Encounter (Signed)
OK to place new referral  Brittany Velazquez, Brittany SchleinAngela M, MD, MPH [facility] 06/22/2017 3:44 PM

## 2017-06-28 ENCOUNTER — Telehealth: Payer: Self-pay | Admitting: Family Medicine

## 2017-06-28 NOTE — Telephone Encounter (Signed)
FYI

## 2017-06-28 NOTE — Telephone Encounter (Signed)
Pt cancelled appointment for MRI cervical spine and has not rescheduled

## 2017-07-02 ENCOUNTER — Ambulatory Visit: Payer: Self-pay

## 2017-07-02 ENCOUNTER — Other Ambulatory Visit: Payer: Self-pay | Admitting: Occupational Medicine

## 2017-07-02 DIAGNOSIS — M546 Pain in thoracic spine: Principal | ICD-10-CM

## 2017-07-02 DIAGNOSIS — G8929 Other chronic pain: Secondary | ICD-10-CM

## 2017-07-02 NOTE — Telephone Encounter (Signed)
Noted  Akemi Overholser, Marzella SchleinAngela M, MD, MPH [facility] 07/02/2017 10:38 AM

## 2017-07-16 ENCOUNTER — Encounter: Payer: Self-pay | Admitting: Physician Assistant

## 2017-07-16 ENCOUNTER — Ambulatory Visit: Payer: BC Managed Care – PPO | Admitting: Physician Assistant

## 2017-07-16 VITALS — BP 120/80 | HR 74 | Temp 98.1°F | Ht 66.0 in | Wt 156.0 lb

## 2017-07-16 DIAGNOSIS — M542 Cervicalgia: Secondary | ICD-10-CM | POA: Diagnosis not present

## 2017-07-16 DIAGNOSIS — M5412 Radiculopathy, cervical region: Secondary | ICD-10-CM

## 2017-07-16 NOTE — Progress Notes (Signed)
Patient: Brittany Velazquez Female    DOB: 03/13/1966   52 y.o.   MRN: [id number] Visit Date: 07/16/2017  Today's Provider: Margaretann LovelessJennifer M Burnette, PA-C   Chief Complaint  Patient presents with  . Neck Pain   Subjective:    Neck Pain    Patient here today C/O recurrent neck pain. Patient was last seen on 06/19/2017 by Dr. Beryle FlockBacigalupo. Dr. Beryle FlockBacigalupo ordered x-ray, that showed no acute abnormality of the thoracic spine. Patient was started on Flexeril PRN by Dr. Beryle FlockBacigalupo. Patient was also referred to [facility]. Patient was seen by Dr. Garlon HatchetGary Cram Jr on 07/12/2017. Patient was started on Medrol 4 mg 6 day taper. Patient reports that she will have an MRI done soon. Patient reports good pain control and good tolerance with medication. Patient is requesting a note stating she is ready to go back to work without any limitations. Patient drives a handicap bus.     Allergies  Allergen Reactions  . Fentanyl Shortness Of Breath  . Codeine Rash     Current Outpatient Medications:  .  cyclobenzaprine (FLEXERIL) 5 MG tablet, Take 1 tablet (5 mg total) by mouth 3 (three) times daily as needed for muscle spasms., Disp: 30 tablet, Rfl: 1 .  fluticasone (FLONASE) 50 MCG/ACT nasal spray, Place 2 sprays into both nostrils daily., Disp: 16 g, Rfl: 6 .  levothyroxine (SYNTHROID, LEVOTHROID) 50 MCG tablet, Take 50 mcg by mouth daily., Disp: , Rfl: 4 .  methylPREDNISolone (MEDROL DOSEPAK) 4 MG TBPK tablet, Take by mouth daily., Disp: , Rfl: 0  Review of Systems  Constitutional: Negative.   Respiratory: Negative.   Cardiovascular: Negative.   Musculoskeletal: Negative.     Social History   Tobacco Use  . Smoking status: Never Smoker  . Smokeless tobacco: Never Used  Substance Use Topics  . Alcohol use: No    Alcohol/week: 0.0 oz   Objective:   BP 120/80 (BP Location: Left Arm, Patient Position: Sitting, Cuff Size: Large)   Pulse 74   Temp 98.1 F (36.7 C) (Oral)   Ht  5\' 6"  (1.676 m)   Wt 156 lb (70.8 kg)   LMP 03/20/2016   SpO2 98%   BMI 25.18 kg/m  Vitals:   07/16/17 1554  BP: 120/80  Pulse: 74  Temp: 98.1 F (36.7 C)  TempSrc: Oral  SpO2: 98%  Weight: 156 lb (70.8 kg)  Height: 5\' 6"  (1.676 m)     Physical Exam  Constitutional: She appears well-developed and well-nourished. No distress.  Neck: Normal range of motion and full passive range of motion without pain. Neck supple. Muscular tenderness present. No spinous process tenderness present. No neck rigidity. Normal range of motion present.  Cardiovascular: Normal rate, regular rhythm and normal heart sounds. Exam reveals no gallop and no friction rub.  No murmur heard. Pulmonary/Chest: Effort normal and breath sounds normal. No respiratory distress. She has no wheezes. She has no rales.  Skin: She is not diaphoretic.  Vitals reviewed.      Assessment & Plan:     1. Neck pain Improving with Medrol dose pak from Dr. Wynetta Emeryram. MRI has been ordered. Awaiting testing date and results. She will f/u with Dr. Wynetta Emeryram same day as MRI, hopefully, so they can further discuss her treatment plan. Today patient exhibits almost complete ROM with only having slight muscular pain at end of range with rotation. All other movements are pain free. No radiculopathy at this time.  Work note will be given to patient to clear for work without limitations. She is to call the office if pain returns.   2. Cervical radiculopathy See above medical treatment plan.       Margaretann Loveless, PA-C  [facility]

## 2017-08-22 ENCOUNTER — Ambulatory Visit: Payer: BC Managed Care – PPO | Admitting: Family Medicine

## 2017-08-22 ENCOUNTER — Encounter: Payer: Self-pay | Admitting: Family Medicine

## 2017-08-22 VITALS — BP 116/72 | HR 76 | Temp 98.0°F | Resp 16 | Wt 154.0 lb

## 2017-08-22 DIAGNOSIS — R002 Palpitations: Secondary | ICD-10-CM | POA: Diagnosis not present

## 2017-08-22 NOTE — Progress Notes (Signed)
Patient: Brittany Velazquez Female    DOB: 07/18/1965   52 y.o.   MRN: [id number] Visit Date: 08/22/2017  Today's Provider: Shirlee LatchAngela Barton Want, MD   I, Joslyn HyEmily Ratchford, CMA, am acting as scribe for Shirlee LatchAngela Rochester Serpe, MD.  Chief Complaint  Patient presents with  . Irregular Heart Beat   Subjective:    HPI   Pt states this problem has been occurring for years, but has worsened since receiving steroid injections for her neck pain. She states she has noticed 2 episodes of this in the last 2 weeks. Denies chest pain, SOB, N/V. Pt also states the first occurrence was followed by exercising. Now she is concerned about exercising.  She describes her heartbeat as pausing for a few seconds and then having a hard beat afterward.  She has had this happen in the past, but it is never lasted for as long as her current episodes, which are lasting about 30 minutes at a time.  She denies any associated dizziness, diaphoresis, nausea, vomiting, chest pain, shortness of breath.  Allergies  Allergen Reactions  . Fentanyl Shortness Of Breath  . Codeine Rash     Current Outpatient Medications:  .  fluticasone (FLONASE) 50 MCG/ACT nasal spray, Place 2 sprays into both nostrils daily., Disp: 16 g, Rfl: 6 .  levothyroxine (SYNTHROID, LEVOTHROID) 50 MCG tablet, Take 50 mcg by mouth daily., Disp: , Rfl: 4 .  cyclobenzaprine (FLEXERIL) 5 MG tablet, Take 1 tablet (5 mg total) by mouth 3 (three) times daily as needed for muscle spasms. (Patient not taking: Reported on 08/22/2017), Disp: 30 tablet, Rfl: 1  Review of Systems  Constitutional: Negative for activity change, appetite change, chills, diaphoresis, fatigue, fever and unexpected weight change.  Respiratory: Negative for shortness of breath.   Cardiovascular: Positive for palpitations. Negative for chest pain and leg swelling.  Musculoskeletal: Positive for neck pain (chronic).    Social History   Tobacco Use  . Smoking status: Never Smoker  .  Smokeless tobacco: Never Used  Substance Use Topics  . Alcohol use: No    Alcohol/week: 0.0 oz   Objective:   BP 116/72 (BP Location: Left Arm, Patient Position: Sitting, Cuff Size: Normal)   Pulse 76   Temp 98 F (36.7 C) (Oral)   Resp 16   Wt 154 lb (69.9 kg)   LMP 03/20/2016   SpO2 99%   BMI 24.86 kg/m  Vitals:   08/22/17 0929  BP: 116/72  Pulse: 76  Resp: 16  Temp: 98 F (36.7 C)  TempSrc: Oral  SpO2: 99%  Weight: 154 lb (69.9 kg)     Physical Exam  Constitutional: She is oriented to person, place, and time. She appears well-developed and well-nourished. No distress.  HENT:  Head: Normocephalic and atraumatic.  Eyes: Conjunctivae are normal. No scleral icterus.  Neck: Neck supple. No thyromegaly present.  Cardiovascular: Normal rate, regular rhythm, normal heart sounds and intact distal pulses.  No murmur heard. Pulmonary/Chest: Effort normal and breath sounds normal. No respiratory distress. She has no wheezes. She has no rales.  Musculoskeletal: She exhibits no edema.  Neurological: She is alert and oriented to person, place, and time.  Psychiatric: She has a normal mood and affect. Her behavior is normal.  Vitals reviewed.   TSH, CBC, CMP in 02/2017 within normal limits    Assessment & Plan:      Problem List Items Addressed This Visit      Other   Palpitations  Long-standing issue, now worsening in She has never been worked up for this previously with a Holter monitor or event monitor She has a benign heart exam today Recent benign labs Somewhat concerning but this seems to occur with stress, steroids, exercise Advised patient to seek medical care if she develops any chest pain or shortness of breath Referral to cardiology for likely 30-day event monitor and other workup      Relevant Orders   Ambulatory referral to Cardiology       Return if symptoms worsen or fail to improve.   The entirety of the information documented in the History  of Present Illness, Review of Systems and Physical Exam were personally obtained by me. Portions of this information were initially documented by Irving Burton Ratchford, CMA and reviewed by me for thoroughness and accuracy.    Erasmo Downer, MD, MPH [facility] 08/22/2017 10:14 AM

## 2017-08-22 NOTE — Assessment & Plan Note (Signed)
Long-standing issue, now worsening in She has never been worked up for this previously with a Holter monitor or event monitor She has a benign heart exam today Recent benign labs Somewhat concerning but this seems to occur with stress, steroids, exercise Advised patient to seek medical care if she develops any chest pain or shortness of breath Referral to cardiology for likely 30-day event monitor and other workup

## 2017-08-22 NOTE — Patient Instructions (Signed)
Palpitations A palpitation is the feeling that your heartbeat is irregular or is faster than normal. It may feel like your heart is fluttering or skipping a beat. Palpitations are usually not a serious problem. They may be caused by many things, including smoking, caffeine, alcohol, stress, and certain medicines. Although most causes of palpitations are not serious, palpitations can be a sign of a serious medical problem. In some cases, you may need further medical evaluation. Follow these instructions at home: Pay attention to any changes in your symptoms. Take these actions to help with your condition:  Avoid the following: ? Caffeinated coffee, tea, soft drinks, diet pills, and energy drinks. ? Chocolate. ? Alcohol.  Do not use any tobacco products, such as cigarettes, chewing tobacco, and e-cigarettes. If you need help quitting, ask your health care provider.  Try to reduce your stress and anxiety. Things that can help you relax include: ? Yoga. ? Meditation. ? Physical activity, such as swimming, jogging, or walking. ? Biofeedback. This is a method that helps you learn to use your mind to control things in your body, such as your heartbeats.  Get plenty of rest and sleep.  Take over-the-counter and prescription medicines only as told by your health care provider.  Keep all follow-up visits as told by your health care provider. This is important.  Contact a health care provider if:  You continue to have a fast or irregular heartbeat after 24 hours.  Your palpitations occur more often. Get help right away if:  You have chest pain or shortness of breath.  You have a severe headache.  You feel dizzy or you faint. This information is not intended to replace advice given to you by your health care provider. Make sure you discuss any questions you have with your health care provider. Document Released: 05/05/2000 Document Revised: 10/11/2015 Document Reviewed: 01/21/2015 Elsevier  Interactive Patient Education  2018 Elsevier Inc.  

## 2017-08-31 ENCOUNTER — Encounter: Payer: Self-pay | Admitting: Family Medicine

## 2017-09-10 ENCOUNTER — Ambulatory Visit: Payer: BC Managed Care – PPO | Admitting: Family Medicine

## 2017-09-18 ENCOUNTER — Ambulatory Visit: Payer: BC Managed Care – PPO | Admitting: Family Medicine

## 2017-09-18 ENCOUNTER — Encounter: Payer: Self-pay | Admitting: Family Medicine

## 2017-09-18 VITALS — BP 118/68 | HR 66 | Temp 97.5°F | Resp 16 | Wt 151.0 lb

## 2017-09-18 DIAGNOSIS — N3091 Cystitis, unspecified with hematuria: Secondary | ICD-10-CM | POA: Diagnosis not present

## 2017-09-18 DIAGNOSIS — N952 Postmenopausal atrophic vaginitis: Secondary | ICD-10-CM

## 2017-09-18 LAB — POCT URINALYSIS DIPSTICK
Bilirubin, UA: NEGATIVE
GLUCOSE UA: NEGATIVE
KETONES UA: NEGATIVE
NITRITE UA: POSITIVE
Odor: NORMAL
Protein, UA: 30
SPEC GRAV UA: 1.02 (ref 1.010–1.025)
Urobilinogen, UA: 0.2 E.U./dL
pH, UA: 6.5 (ref 5.0–8.0)

## 2017-09-18 MED ORDER — FLUCONAZOLE 150 MG PO TABS: 150.0000 mg | ORAL_TABLET | Freq: Once | ORAL | 0 refills | Status: AC

## 2017-09-18 MED ORDER — SULFAMETHOXAZOLE-TRIMETHOPRIM 800-160 MG PO TABS: 1.0000 | ORAL_TABLET | Freq: Two times a day (BID) | ORAL | 0 refills | Status: AC

## 2017-09-18 NOTE — Patient Instructions (Signed)

## 2017-09-18 NOTE — Progress Notes (Signed)
Patient: Brittany Velazquez Female    DOB: 1965/12/27   52 y.o.   MRN: [id number] Visit Date: 09/18/2017  Today's Provider: Shirlee Latch, MD   I, Joslyn Hy, CMA, am acting as scribe for Shirlee Latch, MD.  Chief Complaint  Patient presents with  . Urinary Tract Infection   Subjective:    Urinary Tract Infection   This is a new problem. Episode onset: x 6 days after having sex. The problem has been gradually worsening. The quality of the pain is described as burning. The pain is mild. There has been no fever. She is sexually active. There is no history of pyelonephritis. Associated symptoms include hesitancy and urgency. Pertinent negatives include no chills, discharge, flank pain, frequency, hematuria, nausea, sweats or vomiting. Treatments tried: pt tried Diflucan after thinking this was a yeast infection. The treatment provided no relief.   Was being seen by GYN for dyspareunia, vaginal dryness.  Tried estrogen cream and experienced vaginal bleeding.  This and the fact that her sister had breast cancer scared her off from continuing this.    Allergies  Allergen Reactions  . Fentanyl Shortness Of Breath  . Codeine Rash     Current Outpatient Medications:  .  levothyroxine (SYNTHROID, LEVOTHROID) 50 MCG tablet, Take 50 mcg by mouth daily., Disp: , Rfl: 4 .  fluticasone (FLONASE) 50 MCG/ACT nasal spray, Place 2 sprays into both nostrils daily. (Patient not taking: Reported on 09/18/2017), Disp: 16 g, Rfl: 6  Review of Systems  Constitutional: Negative for activity change, chills, fatigue and fever.  HENT: Negative.   Respiratory: Negative.   Cardiovascular: Negative.   Gastrointestinal: Negative for nausea and vomiting.  Genitourinary: Positive for hesitancy and urgency. Negative for flank pain, frequency and hematuria.  Skin: Negative.   Neurological: Negative.   Psychiatric/Behavioral: Negative.     Social History   Tobacco Use  . Smoking status: Never  Smoker  . Smokeless tobacco: Never Used  Substance Use Topics  . Alcohol use: No    Alcohol/week: 0.0 oz   Objective:   BP 118/68 (BP Location: Left Arm, Patient Position: Sitting, Cuff Size: Normal)   Pulse 66   Temp (!) 97.5 F (36.4 C) (Oral)   Resp 16   Wt 151 lb (68.5 kg)   LMP 03/20/2016   SpO2 99%   BMI 24.37 kg/m  Vitals:   09/18/17 1021  BP: 118/68  Pulse: 66  Resp: 16  Temp: (!) 97.5 F (36.4 C)  TempSrc: Oral  SpO2: 99%  Weight: 151 lb (68.5 kg)     Physical Exam  Constitutional: She is oriented to person, place, and time. She appears well-developed and well-nourished.  HENT:  Head: Normocephalic and atraumatic.  Eyes: Conjunctivae are normal.  Cardiovascular: Normal rate, regular rhythm, normal heart sounds and intact distal pulses.  No murmur heard. Pulmonary/Chest: Effort normal and breath sounds normal. No respiratory distress. She has no wheezes. She has no rales.  Abdominal: Soft. She exhibits no distension. There is no tenderness.  Musculoskeletal: She exhibits no edema.  Neurological: She is alert and oriented to person, place, and time.  Skin: Skin is warm and dry. Capillary refill takes less than 2 seconds. No rash noted.  Psychiatric: She has a normal mood and affect. Her behavior is normal.  Vitals reviewed.   Results for orders placed or performed in visit on 09/18/17  POCT urinalysis dipstick  Result Value Ref Range   Color, UA yellow  Clarity, UA cloudy    Glucose, UA Negative    Bilirubin, UA Negative    Ketones, UA Negative    Spec Grav, UA 1.020 1.010 - 1.025   Blood, UA NH Moderate    pH, UA 6.5 5.0 - 8.0   Protein, UA 30    Urobilinogen, UA 0.2 0.2 or 1.0 E.U./dL   Nitrite, UA positive    Leukocytes, UA Moderate (2+) (A) Negative   Appearance cloudy    Odor normal        Assessment & Plan:   1. Cystitis with hematuria - UA c/w UTI with hematuria - will send culture to confirm - no evidence of pyelo or other  systemic illness - this is 2nd UTI in last 6 months - if continues to have recurrent UTIs, consider urology referral - discussed role of atrophic vaginitis in UTIs as below - given diflucan Rx as she regularly develops yeast vaginitis after abx use - POCT urinalysis dipstick - CULTURE, URINE COMPREHENSIVE  2. Atrophic vaginitis - patient with symptoms of atrophic vaginitis - this is likely contributing to recurrent UTIs - discussed importance of regular lubrication, even if she does not want to use vaginal estrogen    Meds ordered this encounter  Medications  . sulfamethoxazole-trimethoprim (BACTRIM DS,SEPTRA DS) 800-160 MG tablet    Sig: Take 1 tablet by mouth 2 (two) times daily for 5 days.    Dispense:  10 tablet    Refill:  0  . fluconazole (DIFLUCAN) 150 MG tablet    Sig: Take 1 tablet (150 mg total) by mouth once for 1 dose.    Dispense:  1 tablet    Refill:  0     Return if symptoms worsen or fail to improve.   The entirety of the information documented in the History of Present Illness, Review of Systems and Physical Exam were personally obtained by me. Portions of this information were initially documented by Irving Burton Ratchford, CMA and reviewed by me for thoroughness and accuracy.    Erasmo Downer, MD, MPH [facility] 09/18/2017 11:54 AM

## 2017-09-21 ENCOUNTER — Telehealth: Payer: Self-pay

## 2017-09-21 LAB — CULTURE, URINE COMPREHENSIVE

## 2017-09-21 NOTE — Telephone Encounter (Signed)
Left message advising pt. OK per DPR. 

## 2017-09-21 NOTE — Telephone Encounter (Signed)
-----   Message from Erasmo Downer, MD sent at 09/21/2017 12:03 PM EDT ----- Urine culture confirms UTI.  Bacteria is E. coli.  This is sensitive to the antibiotic that she was prescribed.  Erasmo Downer, MD, MPH [facility] 09/21/2017 12:03 PM

## 2017-11-27 ENCOUNTER — Other Ambulatory Visit: Payer: Self-pay | Admitting: Family Medicine

## 2018-01-10 ENCOUNTER — Ambulatory Visit: Payer: BC Managed Care – PPO | Admitting: Family Medicine

## 2018-01-10 ENCOUNTER — Encounter: Payer: Self-pay | Admitting: Family Medicine

## 2018-01-10 VITALS — BP 138/84 | HR 66 | Temp 98.1°F | Resp 16 | Wt 150.0 lb

## 2018-01-10 DIAGNOSIS — M5412 Radiculopathy, cervical region: Secondary | ICD-10-CM

## 2018-01-10 DIAGNOSIS — M542 Cervicalgia: Secondary | ICD-10-CM | POA: Diagnosis not present

## 2018-01-10 MED ORDER — GABAPENTIN 300 MG PO CAPS: 300.0000 mg | ORAL_CAPSULE | Freq: Three times a day (TID) | ORAL | 3 refills | Status: DC

## 2018-01-10 MED ORDER — TRAMADOL HCL 50 MG PO TABS: 50.0000 mg | ORAL_TABLET | Freq: Four times a day (QID) | ORAL | 0 refills | Status: AC | PRN

## 2018-01-10 MED ORDER — NAPROXEN 500 MG PO TABS: 500.0000 mg | ORAL_TABLET | Freq: Two times a day (BID) | ORAL | 2 refills | Status: DC

## 2018-01-10 NOTE — Progress Notes (Signed)
Patient: Brittany Velazquez Female    DOB: 02/12/1966   52 y.o.   MRN: [id number] Visit Date: 01/10/2018  Today's Provider: Shirlee Latch, MD   I, Joslyn Hy, CMA, am acting as scribe for Shirlee Latch, MD.  Chief Complaint  Patient presents with  . Neck Pain   Subjective:    Neck Pain   This is a chronic problem. The problem has been rapidly worsening. The pain is present in the right side. The quality of the pain is described as stabbing and shooting. The pain is moderate. The symptoms are aggravated by twisting. Associated symptoms include tingling (left arm and fingertips) and weakness (left arm). Pertinent negatives include no chest pain, fever (pt felt feverish yesterday, but did not take temp), headaches, leg pain, numbness, pain with swallowing, photophobia, syncope, trouble swallowing, visual change or weight loss. Treatments tried: an old Rx of prednisone, which she tries to avoid because it caused palpitations. The treatment provided mild relief.  Pt has already seen neurology for this, and had an MRI. This did not improve sx, and was costly.     Allergies  Allergen Reactions  . Fentanyl Shortness Of Breath  . Codeine Rash     Current Outpatient Medications:  .  fluticasone (FLONASE) 50 MCG/ACT nasal spray, Place 2 sprays into both nostrils daily., Disp: 48 g, Rfl: 3 .  levothyroxine (SYNTHROID, LEVOTHROID) 50 MCG tablet, Take 50 mcg by mouth daily., Disp: , Rfl: 4  Review of Systems  Constitutional: Negative for fever (pt felt feverish yesterday, but did not take temp) and weight loss.  HENT: Negative for trouble swallowing.   Eyes: Negative for photophobia.  Cardiovascular: Negative for chest pain and syncope.  Musculoskeletal: Positive for neck pain.  Neurological: Positive for tingling (left arm and fingertips) and weakness (left arm). Negative for numbness and headaches.    Social History   Tobacco Use  . Smoking status: Never Smoker  .  Smokeless tobacco: Never Used  Substance Use Topics  . Alcohol use: No    Alcohol/week: 0.0 standard drinks   Objective:   BP 138/84 (BP Location: Left Arm, Patient Position: Sitting, Cuff Size: Normal)   Pulse 66   Temp 98.1 F (36.7 C) (Oral)   Resp 16   Wt 150 lb (68 kg)   LMP 03/20/2016   SpO2 99%   BMI 24.21 kg/m  Vitals:   01/10/18 1441  BP: 138/84  Pulse: 66  Resp: 16  Temp: 98.1 F (36.7 C)  TempSrc: Oral  SpO2: 99%  Weight: 150 lb (68 kg)     Physical Exam  Constitutional: She is oriented to person, place, and time. She appears well-developed and well-nourished. No distress.  HENT:  Head: Normocephalic and atraumatic.  Eyes: Conjunctivae are normal. No scleral icterus.  Neck: Neck supple. No thyromegaly present.  Cardiovascular: Normal rate, regular rhythm, normal heart sounds and intact distal pulses.  No murmur heard. Pulmonary/Chest: Effort normal and breath sounds normal. No respiratory distress. She has no wheezes. She has no rales.  Musculoskeletal: She exhibits no edema.       Cervical back: She exhibits decreased range of motion (especially in lateral flexion and rotation to R side), tenderness (over L trapezius muscle) and spasm (L trapezius). She exhibits no bony tenderness, no swelling and no deformity.  Lymphadenopathy:    She has no cervical adenopathy.  Neurological: She is alert and oriented to person, place, and time. She displays no atrophy. No  cranial nerve deficit or sensory deficit. She exhibits normal muscle tone. Coordination and gait normal.  L arm with 4/5 weakness compared to L arm 5/5 in all muscle groups.  Suspect that pain and patient participation are contributing  Skin: Skin is warm and dry. Capillary refill takes less than 2 seconds. No rash noted.  Psychiatric: She has a normal mood and affect. Her behavior is normal.  Vitals reviewed.      Assessment & Plan:   1. Neck pain 2. Cervical radiculopathy - chronic and  intermittent issue - previously seeing [facility] in [place] - s/p MRI/MRA of neck with 2 disc protrusions - NSG did not feel this was a surgical issue - patient is frustrated because she feels that ESI did not help and all of the tests and procedures cost a lot of money out of pocket - aleve is helping some, so will try Rx for Naprosyn - can use Tramadol sparingly for severe pain - as she has radicular pain, think gabapentin will be helpful - start with 300mg  qhs and escalate to 300mg  TID as tolerated - f/u prn - discussed return precautions   Meds ordered this encounter  Medications  . gabapentin (NEURONTIN) 300 MG capsule    Sig: Take 1 capsule (300 mg total) by mouth 3 (three) times daily.    Dispense:  90 capsule    Refill:  3  . traMADol (ULTRAM) 50 MG tablet    Sig: Take 1 tablet (50 mg total) by mouth every 6 (six) hours as needed for up to 5 days for moderate pain.    Dispense:  20 tablet    Refill:  0  . naproxen (NAPROSYN) 500 MG tablet    Sig: Take 1 tablet (500 mg total) by mouth 2 (two) times daily with a meal.    Dispense:  60 tablet    Refill:  2     Return if symptoms worsen or fail to improve.   The entirety of the information documented in the History of Present Illness, Review of Systems and Physical Exam were personally obtained by me. Portions of this information were initially documented by Irving BurtonEmily Ratchford, CMA and reviewed by me for thoroughness and accuracy.    Erasmo DownerBacigalupo, Fayetta Sorenson M, MD, MPH [facility] 01/10/2018 3:54 PM

## 2018-01-10 NOTE — Patient Instructions (Signed)
Cervical Radiculopathy  Cervical radiculopathy means that a nerve in the neck is pinched or bruised. This can cause pain or loss of feeling (numbness) that runs from your neck to your arm and fingers.  Follow these instructions at home:  Managing pain  ? Take over-the-counter and prescription medicines only as told by your doctor.  ? If directed, put ice on the injured or painful area.  ? Put ice in a plastic bag.  ? Place a towel between your skin and the bag.  ? Leave the ice on for 20 minutes, 2?3 times per day.  ? If ice does not help, you can try using heat. Take a warm shower or warm bath, or use a heat pack as told by your doctor.  ? You may try a gentle neck and shoulder massage.  Activity  ? Rest as needed. Follow instructions from your doctor about any activities to avoid.  ? Do exercises as told by your doctor or physical therapist.  General instructions  ? If you were given a soft collar, wear it as told by your doctor.  ? Use a flat pillow when you sleep.  ? Keep all follow-up visits as told by your doctor. This is important.  Contact a doctor if:  ? Your condition does not improve with treatment.  Get help right away if:  ? Your pain gets worse and is not controlled with medicine.  ? You lose feeling or feel weak in your hand, arm, face, or leg.  ? You have a fever.  ? You have a stiff neck.  ? You cannot control when you poop or pee (have incontinence).  ? You have trouble with walking, balance, or talking.  This information is not intended to replace advice given to you by your health care provider. Make sure you discuss any questions you have with your health care provider.  Document Released: 04/27/2011 Document Revised: 10/14/2015 Document Reviewed: 07/02/2014  Elsevier Interactive Patient Education ? 2018 Else[employer] Inc.

## 2018-02-01 ENCOUNTER — Other Ambulatory Visit: Payer: Self-pay | Admitting: Family Medicine

## 2018-02-05 ENCOUNTER — Ambulatory Visit: Payer: BC Managed Care – PPO | Admitting: Family Medicine

## 2018-03-25 ENCOUNTER — Observation Stay
Admission: EM | Admit: 2018-03-25 | Discharge: 2018-03-26 | Disposition: A | Discharge status: Home / Self Care | Payer: BC Managed Care – PPO | Attending: Internal Medicine | Admitting: Internal Medicine

## 2018-03-25 ENCOUNTER — Other Ambulatory Visit: Payer: Self-pay

## 2018-03-25 ENCOUNTER — Emergency Department: Payer: BC Managed Care – PPO

## 2018-03-25 ENCOUNTER — Encounter: Payer: Self-pay | Admitting: Family Medicine

## 2018-03-25 ENCOUNTER — Ambulatory Visit: Payer: BC Managed Care – PPO | Admitting: Family Medicine

## 2018-03-25 VITALS — BP 130/84 | HR 67 | Temp 98.0°F

## 2018-03-25 DIAGNOSIS — R42 Dizziness and giddiness: Secondary | ICD-10-CM

## 2018-03-25 DIAGNOSIS — H8110 Benign paroxysmal vertigo, unspecified ear: Principal | ICD-10-CM | POA: Insufficient documentation

## 2018-03-25 DIAGNOSIS — Z7951 Long term (current) use of inhaled steroids: Secondary | ICD-10-CM | POA: Insufficient documentation

## 2018-03-25 DIAGNOSIS — Z23 Encounter for immunization: Secondary | ICD-10-CM | POA: Diagnosis not present

## 2018-03-25 DIAGNOSIS — Z885 Allergy status to narcotic agent status: Secondary | ICD-10-CM | POA: Insufficient documentation

## 2018-03-25 DIAGNOSIS — E039 Hypothyroidism, unspecified: Secondary | ICD-10-CM | POA: Insufficient documentation

## 2018-03-25 DIAGNOSIS — Z79899 Other long term (current) drug therapy: Secondary | ICD-10-CM | POA: Diagnosis not present

## 2018-03-25 DIAGNOSIS — Z79890 Hormone replacement therapy (postmenopausal): Secondary | ICD-10-CM | POA: Insufficient documentation

## 2018-03-25 HISTORY — DX: Hypothyroidism, unspecified: E03.9

## 2018-03-25 LAB — BASIC METABOLIC PANEL
ANION GAP: 7 (ref 5–15)
BUN: 16 mg/dL (ref 6–20)
CALCIUM: 9.4 mg/dL (ref 8.9–10.3)
CO2: 28 mmol/L (ref 22–32)
CREATININE: 0.86 mg/dL (ref 0.44–1.00)
Chloride: 106 mmol/L (ref 98–111)
Glucose, Bld: 120 mg/dL — ABNORMAL HIGH (ref 70–99)
Potassium: 4.4 mmol/L (ref 3.5–5.1)
SODIUM: 141 mmol/L (ref 135–145)

## 2018-03-25 LAB — CBC
HCT: 44.7 % (ref 36.0–46.0)
Hemoglobin: 15.6 g/dL — ABNORMAL HIGH (ref 12.0–15.0)
MCH: 33.8 pg (ref 26.0–34.0)
MCHC: 34.9 g/dL (ref 30.0–36.0)
MCV: 96.8 fL (ref 80.0–100.0)
NRBC: 0 % (ref 0.0–0.2)
PLATELETS: 203 10*3/uL (ref 150–400)
RBC: 4.62 MIL/uL (ref 3.87–5.11)
RDW: 11.7 % (ref 11.5–15.5)
WBC: 5.3 10*3/uL (ref 4.0–10.5)

## 2018-03-25 MED ORDER — LEVOTHYROXINE SODIUM 50 MCG PO TABS
50.0000 ug | ORAL_TABLET | Freq: Every day | ORAL | Status: DC
  Administered 2018-03-26: 06:00:00 50 ug via ORAL
  Filled 2018-03-25: qty 1

## 2018-03-25 MED ORDER — ONDANSETRON HCL 4 MG/2ML IJ SOLN
4.0000 mg | Freq: Once | INTRAMUSCULAR | Status: AC
  Administered 2018-03-25: 4 mg via INTRAVENOUS
  Filled 2018-03-25: qty 2

## 2018-03-25 MED ORDER — ONDANSETRON 4 MG PO TBDP
ORAL_TABLET | ORAL | Status: AC
  Filled 2018-03-25: qty 1

## 2018-03-25 MED ORDER — SODIUM CHLORIDE 0.9 % IV SOLN
INTRAVENOUS | Status: DC
  Administered 2018-03-25: via INTRAVENOUS

## 2018-03-25 MED ORDER — ONDANSETRON HCL 4 MG PO TABS: 4.0000 mg | ORAL_TABLET | Freq: Four times a day (QID) | ORAL | Status: DC | PRN

## 2018-03-25 MED ORDER — MECLIZINE HCL 25 MG PO TABS
50.0000 mg | ORAL_TABLET | Freq: Once | ORAL | Status: AC
  Administered 2018-03-25: 50 mg via ORAL
  Filled 2018-03-25 (×2): qty 2

## 2018-03-25 MED ORDER — ASPIRIN 81 MG PO CHEW
324.0000 mg | CHEWABLE_TABLET | Freq: Once | ORAL | Status: AC
  Administered 2018-03-25: 324 mg via ORAL
  Filled 2018-03-25: qty 4

## 2018-03-25 MED ORDER — PROMETHAZINE HCL 25 MG/ML IJ SOLN: 25.0000 mg | Freq: Four times a day (QID) | INTRAMUSCULAR | Status: DC | PRN

## 2018-03-25 MED ORDER — ACETAMINOPHEN 650 MG RE SUPP: 650.0000 mg | Freq: Four times a day (QID) | RECTAL | Status: DC | PRN

## 2018-03-25 MED ORDER — METHYLPREDNISOLONE SODIUM SUCC 125 MG IJ SOLR
125.0000 mg | Freq: Once | INTRAMUSCULAR | Status: AC
  Administered 2018-03-25: 125 mg via INTRAVENOUS
  Filled 2018-03-25 (×2): qty 2

## 2018-03-25 MED ORDER — VALACYCLOVIR HCL 500 MG PO TABS
500.0000 mg | ORAL_TABLET | Freq: Every day | ORAL | Status: DC
  Administered 2018-03-26: 01:00:00 500 mg via ORAL
  Filled 2018-03-25 (×2): qty 1

## 2018-03-25 MED ORDER — ACETAMINOPHEN 325 MG PO TABS
650.0000 mg | ORAL_TABLET | Freq: Four times a day (QID) | ORAL | Status: DC | PRN
  Administered 2018-03-26: 650 mg via ORAL
  Filled 2018-03-25: qty 2

## 2018-03-25 MED ORDER — ONDANSETRON HCL 4 MG/2ML IJ SOLN: 4.0000 mg | Freq: Four times a day (QID) | INTRAMUSCULAR | Status: DC | PRN

## 2018-03-25 MED ORDER — INFLUENZA VAC SPLIT QUAD 0.5 ML IM SUSY
0.5000 mL | PREFILLED_SYRINGE | INTRAMUSCULAR | Status: AC
  Administered 2018-03-26: 0.5 mL via INTRAMUSCULAR
  Filled 2018-03-25: qty 0.5

## 2018-03-25 MED ORDER — DIAZEPAM 5 MG PO TABS
5.0000 mg | ORAL_TABLET | Freq: Once | ORAL | Status: AC
  Administered 2018-03-25: 5 mg via ORAL
  Filled 2018-03-25: qty 1

## 2018-03-25 MED ORDER — ONDANSETRON 4 MG PO TBDP
4.0000 mg | ORAL_TABLET | Freq: Once | ORAL | Status: AC
  Administered 2018-03-25: 4 mg via ORAL

## 2018-03-25 MED ORDER — SODIUM CHLORIDE 0.9 % IV SOLN
Freq: Once | INTRAVENOUS | Status: AC
  Administered 2018-03-25: 18:00:00 via INTRAVENOUS

## 2018-03-25 MED ORDER — ENOXAPARIN SODIUM 40 MG/0.4ML ~~LOC~~ SOLN
40.0000 mg | SUBCUTANEOUS | Status: DC
  Administered 2018-03-25: 40 mg via SUBCUTANEOUS
  Filled 2018-03-25: qty 0.4

## 2018-03-25 MED ORDER — MECLIZINE HCL 25 MG PO TABS
25.0000 mg | ORAL_TABLET | Freq: Three times a day (TID) | ORAL | Status: DC
  Administered 2018-03-26 (×2): 25 mg via ORAL
  Filled 2018-03-25 (×5): qty 1

## 2018-03-25 NOTE — Progress Notes (Signed)
Patient: Brittany Velazquez Female    DOB: 03/28/1966   52 y.o.   MRN: [id number] Visit Date: 03/25/2018  Today's Provider: Shirlee Latch, MD   Chief Complaint  Patient presents with  . Dizziness   Subjective:    I, [name], CMA, am acting as a scribe for Shirlee Latch, MD.   Dizziness  This is a new problem. The current episode started yesterday. The problem occurs constantly. The problem has been gradually worsening. Associated symptoms include headaches, nausea, neck pain and a visual change. Treatments tried: Meclizine 25 mg. The treatment provided no relief.   Sinus pain yesterday and Bad smell in nose.  Has had sustained vertigo with room spinning, nystagmus, N/V x2 days.  She has had no relief.   No position changes relieve this, but she does acutely worsen with any movement of her head. She has had BPPV symptoms previously, but this is much more severe and longer lasting.  She just wants relief.        Allergies  Allergen Reactions  . Fentanyl Shortness Of Breath  . Codeine Rash     Current Outpatient Medications:  .  fluticasone (FLONASE) 50 MCG/ACT nasal spray, Place 2 sprays into both nostrils daily., Disp: 48 g, Rfl: 3 .  gabapentin (NEURONTIN) 300 MG capsule, TAKE 1 CAPSULE BY MOUTH THREE TIMES A DAY, Disp: 270 capsule, Rfl: 2 .  levothyroxine (SYNTHROID, LEVOTHROID) 50 MCG tablet, Take 50 mcg by mouth daily., Disp: , Rfl: 4 .  naproxen (NAPROSYN) 500 MG tablet, Take 1 tablet (500 mg total) by mouth 2 (two) times daily with a meal., Disp: 60 tablet, Rfl: 2 No current facility-administered medications for this visit.   Facility-Administered Medications Ordered in Other Visits:  .  ondansetron (ZOFRAN-ODT) 4 MG disintegrating tablet, , , ,   Review of Systems  Constitutional: Negative.   Eyes: Positive for visual disturbance.  Respiratory: Negative.   Cardiovascular: Negative.   Gastrointestinal: Positive for nausea.  Musculoskeletal: Positive  for neck pain.  Neurological: Positive for dizziness and headaches.    Social History   Tobacco Use  . Smoking status: Never Smoker  . Smokeless tobacco: Never Used  Substance Use Topics  . Alcohol use: No    Alcohol/week: 0.0 standard drinks   Objective:   BP 130/84 (BP Location: Left Arm, Patient Position: Sitting, Cuff Size: Normal)   Pulse 67   Temp 98 F (36.7 C) (Oral)   LMP 03/20/2016   SpO2 99%  Vitals:   03/25/18 1340  BP: 130/84  Pulse: 67  Temp: 98 F (36.7 C)  TempSrc: Oral  SpO2: 99%     Physical Exam  Constitutional: She is oriented to person, place, and time. She appears well-developed and well-nourished. She appears ill. No distress.  Ill appearing but not distressed Sitting in wheelchair and very cautious to not move her head  HENT:  Head: Normocephalic and atraumatic.  Right Ear: External ear normal.  Left Ear: External ear normal.  Nose: Nose normal.  Mouth/Throat: Oropharynx is clear and moist. No oropharyngeal exudate.  Eyes: Pupils are equal, round, and reactive to light. Conjunctivae are normal. Right eye exhibits no discharge. Left eye exhibits no discharge. No scleral icterus. Right eye exhibits nystagmus. Left eye exhibits nystagmus.  Neck: Neck supple. No thyromegaly present.  Cardiovascular: Normal rate, regular rhythm, normal heart sounds and intact distal pulses.  No murmur heard. Pulmonary/Chest: Effort normal and breath sounds normal. No respiratory distress. She  has no wheezes. She has no rales.  Abdominal: Soft. She exhibits no distension. There is no tenderness.  Musculoskeletal: She exhibits no edema.  Lymphadenopathy:    She has no cervical adenopathy.  Neurological: She is alert and oriented to person, place, and time. She has normal strength. No cranial nerve deficit or sensory deficit. She exhibits normal muscle tone. She displays no seizure activity.  Skin: Skin is warm and dry. Capillary refill takes less than 2 seconds. No  rash noted.  Psychiatric: She has a normal mood and affect. Her behavior is normal.  Vitals reviewed.       Assessment & Plan:   1. Vertigo Advised to go to Emergency Department as she is acutely symptomatic with sustained vertigo x2d,  She likely needs MRI Brain to rule out intracranial pathology and vestibular neuritis She may benefit from Diazepam for symptom management   Return if symptoms worsen or fail to improve.   The entirety of the information documented in the History of Present Illness, Review of Systems and Physical Exam were personally obtained by me. Portions of this information were initially documented by [name], CMA and reviewed by me for thoroughness and accuracy.    Erasmo Downer, MD, MPH [facility] 03/25/2018 2:49 PM

## 2018-03-25 NOTE — ED Notes (Signed)
Pt states room is spinning.  Taken to CT scan.

## 2018-03-25 NOTE — H&P (Signed)
[facility] [place] [facility]   PATIENT NAME: Brittany Velazquez    MR#:  [id number]  DATE OF BIRTH:  12/05/1965  DATE OF ADMISSION:  03/25/2018  PRIMARY CARE PHYSICIAN: Erasmo Downer, MD   REQUESTING/REFERRING PHYSICIAN: Dr. Daryel November  CHIEF COMPLAINT:   Chief Complaint  Patient presents with  . Dizziness  . Migraine    HISTORY OF PRESENT ILLNESS:  Brittany Velazquez  is a 52 y.o. female with no significant past medical history other than hypothyroidism, presents to hospital secondary to worsening dizziness that started yesterday. Patient states that she was in her normal state of health up until yesterday afternoon.  She started having severe dizziness, feeling of the room spinning very rapidly around her.  Associated with nausea and vomiting.  Denies any headache.  Denies any triggering factor.  No recent fevers or upper respiratory tract infection.  Though she had some changes with the weather recently with boggy eyes, twitching and feeling of dry sinuses.  She states if she is at rest without moving her head, she feels okay.  Even the slightest movement of head or change in position causing extreme dizziness.  Symptoms did not improve with meclizine, steroids and Phenergan in the ED.  MRI has been ordered and pending at this time.  PAST MEDICAL HISTORY:   Past Medical History:  Diagnosis Date  . Annular tear of cervical disc 01/2014   Referred to Dr. Wynetta Emery 03/03/2014  . History of chicken pox   . History of mumps   . Hypothyroidism     PAST SURGICAL HISTORY:   Past Surgical History:  Procedure Laterality Date  . TEMPOROMANDIBULAR JOINT SURGERY  1989    SOCIAL HISTORY:   Social History   Tobacco Use  . Smoking status: Never Smoker  . Smokeless tobacco: Never Used  Substance Use Topics  . Alcohol use: No    Alcohol/week: 0.0 standard drinks    FAMILY HISTORY:   Family History  Problem Relation Age of Onset  . Dementia Mother     . Prostate cancer Father   . Breast cancer Sister     DRUG ALLERGIES:   Allergies  Allergen Reactions  . Fentanyl Shortness Of Breath  . Codeine Rash    REVIEW OF SYSTEMS:   Review of Systems  Constitutional: Negative for chills, fever, malaise/fatigue and weight loss.  HENT: Negative for ear discharge, ear pain, hearing loss and nosebleeds.   Eyes: Negative for blurred vision, double vision and photophobia.  Respiratory: Negative for cough, hemoptysis, shortness of breath and wheezing.   Cardiovascular: Negative for chest pain, palpitations, orthopnea and leg swelling.  Gastrointestinal: Positive for nausea and vomiting. Negative for abdominal pain, constipation, diarrhea, heartburn and melena.  Genitourinary: Negative for dysuria, frequency, hematuria and urgency.  Musculoskeletal: Negative for back pain, myalgias and neck pain.  Skin: Negative for rash.  Neurological: Positive for dizziness and headaches. Negative for tingling, tremors, sensory change, speech change and focal weakness.  Endo/Heme/Allergies: Does not bruise/bleed easily.  Psychiatric/Behavioral: Negative for depression.    MEDICATIONS AT HOME:   Prior to Admission medications   Medication Sig Start Date End Date Taking? Authorizing Provider  fluticasone (FLONASE) 50 MCG/ACT nasal spray Place 2 sprays into both nostrils daily. Patient taking differently: Place 2 sprays into both nostrils daily as needed for allergies.  11/27/17  Yes Bacigalupo, Marzella Schlein, MD  levothyroxine (SYNTHROID, LEVOTHROID) 50 MCG tablet Take 50 mcg by mouth daily. 08/05/15  Yes [provider]  naproxen (NAPROSYN) 500 MG tablet Take 1 tablet (500 mg total) by mouth 2 (two) times daily with a meal. Patient taking differently: Take 500 mg by mouth 2 (two) times daily as needed for moderate pain.  01/10/18  Yes Bacigalupo, Marzella Schlein, MD  valACYclovir (VALTREX) 500 MG tablet Take 500 mg by mouth at bedtime.   Yes [provider]      VITAL SIGNS:  Blood pressure 130/76, pulse 71, temperature 98.4 F (36.9 C), temperature source Oral, resp. rate 18, height 5\' 6"  (1.676 m), weight 68 kg, last menstrual period 03/20/2016, SpO2 99 %.  PHYSICAL EXAMINATION:   Physical Exam  GENERAL:  52 y.o.-year-old patient lying in the bed with no acute distress.  EYES: Pupils equal, round, reactive to light and accommodation. No scleral icterus. Extraocular muscles intact.  HEENT: Head atraumatic, normocephalic. Oropharynx and nasopharynx clear.  NECK:  Supple, no jugular venous distention. No thyroid enlargement, no tenderness.  LUNGS: Normal breath sounds bilaterally, no wheezing, rales,rhonchi or crepitation. No use of accessory muscles of respiration.  CARDIOVASCULAR: S1, S2 normal. No murmurs, rubs, or gallops.  ABDOMEN: Soft, nontender, nondistended. Bowel sounds present. No organomegaly or mass.  EXTREMITIES: No pedal edema, cyanosis, or clubbing.  NEUROLOGIC: Cranial nerves II through XII are intact. No nystagmus. Muscle strength 5/5 in all extremities. Sensation intact. Gait not checked.  PSYCHIATRIC: The patient is alert and oriented x 3.  SKIN: No obvious rash, lesion, or ulcer.   LABORATORY PANEL:   CBC Recent Labs  Lab 03/25/18 1443  WBC 5.3  HGB 15.6*  HCT 44.7  PLT 203   ------------------------------------------------------------------------------------------------------------------  Chemistries  Recent Labs  Lab 03/25/18 1443  NA 141  K 4.4  CL 106  CO2 28  GLUCOSE 120*  BUN 16  CREATININE 0.86  CALCIUM 9.4   ------------------------------------------------------------------------------------------------------------------  Cardiac Enzymes No results for input(s): TROPONINI in the last 168 hours. ------------------------------------------------------------------------------------------------------------------  RADIOLOGY:  Ct Head Wo Contrast  Result Date: 03/25/2018 CLINICAL DATA:   Headache and dizziness since yesterday morning. EXAM: CT HEAD WITHOUT CONTRAST TECHNIQUE: Contiguous axial images were obtained from the base of the skull through the vertex without intravenous contrast. COMPARISON:  None. FINDINGS: Brain: The ventricles are normal in size and configuration. No extra-axial fluid collections are identified. The gray-white differentiation is maintained. No CT findings for acute hemispheric infarction or intracranial hemorrhage. No mass lesions. The brainstem and cerebellum are normal. Vascular: No hyperdense vessels or obvious aneurysm. Skull: No acute skull fracture.  No bone lesion. Sinuses/Orbits: The paranasal sinuses and mastoid air cells are clear. The globes are intact. Other: No scalp lesions, laceration or hematoma. IMPRESSION: Normal head CT. Electronically Signed   By: Rudie Meyer M.D.   On: 03/25/2018 15:01    EKG:   Orders placed or performed during the hospital encounter of 03/25/18  . ED EKG  . ED EKG    IMPRESSION AND PLAN:   Brittany Velazquez  is a 52 y.o. female with no significant past medical history other than hypothyroidism, presents to hospital secondary to worsening dizziness that started yesterday.  1.  Vertigo-could be benign positional vertigo.  However stroke cannot be ruled out-posterior circulation infarct -Check the MRI and carotid Dopplers. -We will also get an echocardiogram as patient has just seen a cardiologist for possible A. Fib-but her cardiac monitor revealed premature atrial contractions.  So an echocardiogram was supposed to be done and pending -Continue meclizine, phenergan and IV fluids  2.  Hypothyroidism-continue  Synthroid  3.  Allergies-improved now.  4.  DVT prophylaxis-Lovenox  PT for vertigo   All the records are reviewed and case discussed with ED provider. Management plans discussed with the patient, family and they are in agreement.  CODE STATUS: Full Code*  TOTAL TIME TAKING CARE OF THIS PATIENT: 50  minutes.    Enid Baas M.D on 03/25/2018 at 9:42 PM  Between 7am to 6pm - Pager - [phone]  After 6pm go to [email or web address] - Social research officer, government  [facility]  Office  [phone]  CC: Primary care physician; Erasmo Downer, MD

## 2018-03-25 NOTE — ED Notes (Signed)
Pt assisted with use of bedpan. Tolerated well. No distress noted. Continues to have complaints of dizziness. Provided for safety and comfort and will continue to assess.

## 2018-03-25 NOTE — ED Provider Notes (Signed)
[facility] Emergency Department Provider Note       Time seen: ----------------------------------------- 6:40 PM on 03/25/2018 -----------------------------------------   I have reviewed the triage vital signs and the nursing notes.  HISTORY   Chief Complaint Dizziness and Migraine    HPI Brittany Velazquez is a 52 y.o. female with a history of palpitations, cervical radiculopathy, neck pain, paresthesias, peripheral vertigo who presents to the ED for dizziness and headache since yesterday morning at 8 AM.  Patient reports this female has changed since the dizziness began.  She arrives alert and oriented with a history of vertigo and has been taking meclizine without any improvement.  Patient feels like her weight is on the back of her head and pulling her neck backwards.  She does have nausea and complains of room spinning with turning her head to either side or changing position.  Past Medical History:  Diagnosis Date  . Annular tear of cervical disc 01/2014   Referred to Dr. Wynetta Emery 03/03/2014  . History of chicken pox   . History of mumps     Patient Active Problem List   Diagnosis Date Noted  . Palpitations 08/22/2017  . Cervical radiculopathy 06/19/2017  . Annular tear of cervical disc 10/30/2014  . Neck pain 10/30/2014  . Paresthesias 10/30/2014    Past Surgical History:  Procedure Laterality Date  . TEMPOROMANDIBULAR JOINT SURGERY  1989    Allergies Fentanyl and Codeine  Social History Social History   Tobacco Use  . Smoking status: Never Smoker  . Smokeless tobacco: Never Used  Substance Use Topics  . Alcohol use: No    Alcohol/week: 0.0 standard drinks  . Drug use: No   Review of Systems Constitutional: Negative for fever. Cardiovascular: Negative for chest pain. Respiratory: Negative for shortness of breath. Gastrointestinal: Negative for abdominal pain, positive for nausea and vomiting Musculoskeletal: Negative for back  pain. Skin: Negative for rash. Neurological: Positive for headache, vertigo  All systems negative/normal/unremarkable except as stated in the HPI  ____________________________________________   PHYSICAL EXAM:  VITAL SIGNS: ED Triage Vitals [03/25/18 1434]  Enc Vitals Group     BP 130/76     Pulse Rate 71     Resp 18     Temp 98.4 F (36.9 C)     Temp Source Oral     SpO2 99 %     Weight 150 lb (68 kg)     Height 5\' 6"  (1.676 m)     Head Circumference      Peak Flow      Pain Score 5     Pain Loc      Pain Edu?      Excl. in GC?    Constitutional: Alert and oriented. Well appearing and in no distress. Eyes: Conjunctivae are normal. Normal extraocular movements. ENT   Head: Normocephalic and atraumatic.  TMs are clear   Nose: No congestion/rhinnorhea.   Mouth/Throat: Mucous membranes are moist.   Neck: No stridor. Cardiovascular: Normal rate, regular rhythm. No murmurs, rubs, or gallops. Respiratory: Normal respiratory effort without tachypnea nor retractions. Breath sounds are clear and equal bilaterally. No wheezes/rales/rhonchi. Gastrointestinal: Soft and nontender. Normal bowel sounds Musculoskeletal: Nontender with normal range of motion in extremities. No lower extremity tenderness nor edema. Neurologic:  Normal speech and language. No gross focal neurologic deficits are appreciated.  Skin:  Skin is warm, dry and intact. No rash noted. Psychiatric: Mood and affect are normal. Speech and behavior are normal.  ____________________________________________  ED COURSE:  As part of my medical decision making, I reviewed the following data within the electronic MEDICAL RECORD NUMBER History obtained from family if available, nursing notes, old chart and ekg, as well as notes from prior ED visits. Patient presented for vertigo, we will assess with labs and imaging as indicated at this time.  EKG: Interpreted by me, sinus rhythm rate 68 bpm, normal PR interval,  normal QRS, normal QT.   Procedures ____________________________________________   LABS (pertinent positives/negatives)  Labs Reviewed  BASIC METABOLIC PANEL - Abnormal; Notable for the following components:      Result Value   Glucose, Bld 120 (*)    All other components within normal limits  CBC - Abnormal; Notable for the following components:   Hemoglobin 15.6 (*)    All other components within normal limits  URINALYSIS, COMPLETE (UACMP) WITH MICROSCOPIC    RADIOLOGY Images were viewed by me  CT head is unremarkable  ____________________________________________  DIFFERENTIAL DIAGNOSIS   Peripheral vertigo, central vertigo, dehydration, electrolyte abnormality, migraine, CVA  FINAL ASSESSMENT AND PLAN  Intractable vertigo, ataxia   Plan: The patient had presented for resistant vertigo. Patient's labs are within normal limits. Patient's imaging not reveal any acute process.  She was given fluids as well as meclizine and Valium and Zofran.  She has not had any improvement in her symptoms despite the previously mentioned therapy.  Have given her aspirin as well as IV Solu-Medrol to see if this would help and to cover her for possible stroke.  I will order an MRI and MRA.  I will discuss with the hospitalist for admission.   Ulice Dash, MD   Note: This note was generated in part or whole with voice recognition software. Voice recognition is usually quite accurate but there are transcription errors that can and very often do occur. I apologize for any typographical errors that were not detected and corrected.     Emily Filbert, MD 03/25/18 2012

## 2018-03-25 NOTE — ED Triage Notes (Addendum)
C/o dizziness and headache since 03/24/18 8AM. States sense of smell has changed since dizziness began. Pt alert and oriented X4, active, cooperative, pt in NAD. RR even and unlabored, color WNL.  Denies extremity weakness.  Hx of vertigo.  States that she feels like a weight is on back of head, pulling neck backwards. Difficult to sit up in chair. Nausea.

## 2018-03-25 NOTE — ED Notes (Signed)
Unable to tolerate PO zofran.

## 2018-03-25 NOTE — ED Notes (Signed)
AAOx3.  Skin warm and dry.  MAE equally and strong.  C/o dizziness.

## 2018-03-26 ENCOUNTER — Observation Stay: Payer: BC Managed Care – PPO

## 2018-03-26 LAB — BASIC METABOLIC PANEL WITH GFR
Anion gap: 7 (ref 5–15)
BUN: 18 mg/dL (ref 6–20)
CO2: 25 mmol/L (ref 22–32)
Calcium: 9 mg/dL (ref 8.9–10.3)
Chloride: 109 mmol/L (ref 98–111)
Creatinine, Ser: 0.76 mg/dL (ref 0.44–1.00)
GFR calc Af Amer: >60 mL/min (ref >60)
GFR calc non Af Amer: >60 mL/min (ref >60)
Glucose, Bld: 168 mg/dL — ABNORMAL HIGH (ref 70–99)
Potassium: 3.9 mmol/L (ref 3.5–5.1)
Sodium: 141 mmol/L (ref 135–145)

## 2018-03-26 LAB — CBC
HCT: 42.6 % (ref 36.0–46.0)
Hemoglobin: 14.6 g/dL (ref 12.0–15.0)
MCH: 33.6 pg (ref 26.0–34.0)
MCHC: 34.3 g/dL (ref 30.0–36.0)
MCV: 97.9 fL (ref 80.0–100.0)
Platelets: 195 K/uL (ref 150–400)
RBC: 4.35 MIL/uL (ref 3.87–5.11)
RDW: 11.4 % — ABNORMAL LOW (ref 11.5–15.5)
WBC: 7.4 K/uL (ref 4.0–10.5)
nRBC: 0 % (ref 0.0–0.2)

## 2018-03-26 MED ORDER — DIAZEPAM 5 MG PO TABS
5.0000 mg | ORAL_TABLET | Freq: Once | ORAL | Status: AC
  Administered 2018-03-26: 5 mg via ORAL
  Filled 2018-03-26: qty 1

## 2018-03-26 MED ORDER — MECLIZINE HCL 25 MG PO TABS: 25.0000 mg | ORAL_TABLET | Freq: Two times a day (BID) | ORAL | 0 refills | Status: AC | PRN
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
## 2018-03-26 NOTE — Care Management Note (Signed)
Case Management Note  Patient Details  Name: Brittany Velazquez MRN: [id number] Date of Birth: 1966-03-30  Subjective/Objective:  Admitted to [facility] with the diagnosis of dizziness. Lives with spouse. Sees Dr. Beryle Flock as primary care physician                  Action/Plan: Physical therapy evaluation completed, Recommending home with physical therapy. Dr. Elpidio Anis ordered an appointment with [facility].    Expected Discharge Date:  03/26/18               Expected Discharge Plan:     In-House Referral:   yes  Discharge planning Services   yes  Post Acute Care Choice:    Choice offered to:     DME Arranged:    DME Agency:     HH Arranged:    HH Agency:     Status of Service:     If discussed at [employer], dates discussed:    Additional Comments:  Gwenette Greet, RN MSN CCM Care Management [phone] 03/26/2018, 2:56 PM

## 2018-03-26 NOTE — Progress Notes (Signed)
Referral  Brittany Velazquez needs follow up at  [facility] - [place], [place] for Dizziness/Vertgo.  Please schedule with Next available appointment.  Thanks

## 2018-03-27 LAB — HIV ANTIBODY (ROUTINE TESTING W REFLEX): HIV Screen 4th Generation wRfx: NONREACTIVE

## 2018-03-28 NOTE — Discharge Summary (Signed)
[facility] [place] [facility]   PATIENT NAME: Brittany Velazquez    MR#:  [id number]  DATE OF BIRTH:  Jul 25, 1965  DATE OF ADMISSION:  03/25/2018 ADMITTING PHYSICIAN: Enid Baas, MD  DATE OF DISCHARGE: 03/26/2018  3:57 PM  PRIMARY CARE PHYSICIAN: Erasmo Downer, MD   ADMISSION DIAGNOSIS:  Dizziness [R42] Vertigo [R42]  DISCHARGE DIAGNOSIS:  Active Problems:   Dizziness   SECONDARY DIAGNOSIS:   Past Medical History:  Diagnosis Date  . Annular tear of cervical disc 01/2014   Referred to Dr. Wynetta Emery 03/03/2014  . History of chicken pox   . History of mumps   . Hypothyroidism      ADMITTING HISTORY  HISTORY OF PRESENT ILLNESS:  Brittany Velazquez  is a 52 y.o. female with no significant past medical history other than hypothyroidism, presents to hospital secondary to worsening dizziness that started yesterday. Patient states that she was in her normal state of health up until yesterday afternoon.  She started having severe dizziness, feeling of the room spinning very rapidly around her.  Associated with nausea and vomiting.  Denies any headache.  Denies any triggering factor.  No recent fevers or upper respiratory tract infection.  Though she had some changes with the weather recently with boggy eyes, twitching and feeling of dry sinuses.  She states if she is at rest without moving her head, she feels okay.  Even the slightest movement of head or change in position causing extreme dizziness.  Symptoms did not improve with meclizine, steroids and Phenergan in the ED.  MRI has been ordered and pending at this time.  HOSPITAL COURSE:   *Benign positional vertigo Patient was admitted to medical floor.  She has had milder symptoms in the past but this was severe.  Was barely able to walk.  Started on meclizine scheduled and symptoms improved some.  Her nausea and vomiting resolved.  Patient's dizziness persisted.  Seen by physical therapy.  Patient is being  discharged home with scheduled meclizine to follow-up with ENT and vestibular rehab.  Referral sent.  MRI of the brain was done and showed no acute abnormalities.  Orthostats were negative on day of discharge. No signs of infection.  Stable for discharge home with supervision.  CONSULTS OBTAINED:    DRUG ALLERGIES:   Allergies  Allergen Reactions  . Fentanyl Shortness Of Breath  . Codeine Rash    DISCHARGE MEDICATIONS:   Allergies as of 03/26/2018      Reactions   Fentanyl Shortness Of Breath   Codeine Rash      Medication List    TAKE these medications   fluticasone 50 MCG/ACT nasal spray Commonly known as:  FLONASE Place 2 sprays into both nostrils daily. What changed:    when to take this  reasons to take this   levothyroxine 50 MCG tablet Commonly known as:  SYNTHROID, LEVOTHROID Take 50 mcg by mouth daily.   meclizine 25 MG tablet Commonly known as:  ANTIVERT Take 1 tablet (25 mg total) by mouth 2 (two) times daily as needed for dizziness. Take it scheduled for 3 days and then as needed for dizziness   naproxen 500 MG tablet Commonly known as:  NAPROSYN Take 1 tablet (500 mg total) by mouth 2 (two) times daily with a meal. What changed:    when to take this  reasons to take this   valACYclovir 500 MG tablet Commonly known as:  VALTREX Take 500 mg by mouth at bedtime.  Today   VITAL SIGNS:  Blood pressure 112/68, pulse (!) 56, temperature 98.2 F (36.8 C), temperature source Oral, resp. rate 20, height 5\' 6"  (1.676 m), weight 69.3 kg, last menstrual period 03/20/2016, SpO2 96 %.  I/O:  No intake or output data in the 24 hours ending 03/28/18 1021  PHYSICAL EXAMINATION:  Physical Exam  GENERAL:  52 y.o.-year-old patient lying in the bed with no acute distress.  LUNGS: Normal breath sounds bilaterally, no wheezing, rales,rhonchi or crepitation. No use of accessory muscles of respiration.  CARDIOVASCULAR: S1, S2 normal. No murmurs, rubs,  or gallops.  ABDOMEN: Soft, non-tender, non-distended. Bowel sounds present. No organomegaly or mass.  NEUROLOGIC: Moves all 4 extremities. PSYCHIATRIC: The patient is alert and oriented x 3.  SKIN: No obvious rash, lesion, or ulcer.   DATA REVIEW:   CBC Recent Labs  Lab 03/26/18 0501  WBC 7.4  HGB 14.6  HCT 42.6  PLT 195    Chemistries  Recent Labs  Lab 03/26/18 0501  NA 141  K 3.9  CL 109  CO2 25  GLUCOSE 168*  BUN 18  CREATININE 0.76  CALCIUM 9.0    Cardiac Enzymes No results for input(s): TROPONINI in the last 168 hours.  Microbiology Results  Results for orders placed or performed in visit on 09/18/17  CULTURE, URINE COMPREHENSIVE     Status: Abnormal   Collection Time: 09/18/17 11:36 AM  Result Value Ref Range Status   Urine Culture, Comprehensive Final report (A)  Final   Organism ID, Bacteria Escherichia coli (A)  Final    Comment: 50,000-100,000 colony forming units per mL Susceptibility profile is consistent with a probable ESBL.    ANTIMICROBIAL SUSCEPTIBILITY Comment  Final    Comment:       ** S = Susceptible; I = Intermediate; R = Resistant **                    P = Positive; N = Negative             MICS are expressed in micrograms per mL    Antibiotic                 RSLT#1    RSLT#2    RSLT#3    RSLT#4 Amoxicillin/Clavulanic Acid    S Ampicillin                     R Cefazolin                      R Cefepime                       R Ceftriaxone                    R Cefuroxime                     R Ciprofloxacin                  R Ertapenem                      S Gentamicin                     S Imipenem                       S Levofloxacin  R Meropenem                      S Nitrofurantoin                 S Piperacillin/Tazobactam        S Tetracycline                   R Tobramycin                     S Trimethoprim/Sulfa             S     RADIOLOGY:  No results found.  Follow up with PCP in 1  week.  Management plans discussed with the patient, family and they are in agreement.  CODE STATUS:  Code Status History    Date Active Date Inactive Code Status Order ID Comments User Context   03/25/2018 2313 03/26/2018 1857 Full Code [id number]  Enid Baas, MD Inpatient      TOTAL TIME TAKING CARE OF THIS PATIENT ON DAY OF DISCHARGE: more than 30 minutes.   Orie Fisherman M.D on 03/28/2018 at 10:21 AM  Between 7am to 6pm - Pager - [phone]  After 6pm go to [email or web address] - password [email or web address] [facility]  [facility] [place] [facility]  Office  [phone]  CC: Primary care physician; Erasmo Downer, MD  Note: This dictation was prepared with Dragon dictation along with smaller phrase technology. Any transcriptional errors that result from this process are unintentional.

## 2018-04-01 ENCOUNTER — Inpatient Hospital Stay: Payer: BC Managed Care – PPO | Admitting: Family Medicine

## 2018-04-17 DIAGNOSIS — E039 Hypothyroidism, unspecified: Secondary | ICD-10-CM | POA: Insufficient documentation

## 2018-04-17 DIAGNOSIS — B029 Zoster without complications: Secondary | ICD-10-CM | POA: Insufficient documentation

## 2018-05-27 LAB — LIPID PANEL
Cholesterol: 270 — AB (ref 0–200)
HDL: 63 (ref 35–70)
LDL Cholesterol: 182
Triglycerides: 126 (ref 40–160)

## 2018-05-27 LAB — BASIC METABOLIC PANEL
BUN: 15 (ref 4–21)
CREATININE: 0.8 (ref 0.5–1.1)
Glucose: 84
Potassium: 4.4 (ref 3.4–5.3)
Sodium: 142 (ref 137–147)

## 2018-05-27 LAB — HEPATIC FUNCTION PANEL
ALT: 16 (ref 7–35)
AST: 22 (ref 13–35)
Alkaline Phosphatase: 80 (ref 25–125)
BILIRUBIN, TOTAL: 0.5

## 2018-05-27 LAB — HM MAMMOGRAPHY

## 2018-05-27 LAB — HM PAP SMEAR

## 2018-05-27 LAB — TSH: TSH: 1.75 (ref 0.41–5.90)

## 2018-05-27 LAB — HEMOGLOBIN A1C: Hemoglobin A1C: 5.1

## 2018-05-28 LAB — CBC AND DIFFERENTIAL
HCT: 41 (ref 36–46)
Hemoglobin: 14.6 (ref 12.0–16.0)
Neutrophils Absolute: 3
PLATELETS: 204 (ref 150–399)
WBC: 4.3

## 2018-05-29 LAB — CHLORIDE
ALBUMIN: 4.7
CALCIUM: 9.7
CO2: 25
Chloride: 102
Total Protein: 7.2 g/dL

## 2018-06-28 ENCOUNTER — Ambulatory Visit: Payer: BC Managed Care – PPO | Admitting: Physician Assistant

## 2018-06-28 ENCOUNTER — Encounter: Payer: Self-pay | Admitting: Physician Assistant

## 2018-06-28 VITALS — BP 115/73 | HR 105 | Temp 98.3°F | Wt 150.0 lb

## 2018-06-28 DIAGNOSIS — J0110 Acute frontal sinusitis, unspecified: Secondary | ICD-10-CM | POA: Diagnosis not present

## 2018-06-28 MED ORDER — AZELASTINE HCL 0.1 % NA SOLN: 1.0000 | Freq: Two times a day (BID) | NASAL | 12 refills | Status: AC

## 2018-06-28 NOTE — Progress Notes (Signed)
Patient: Brittany Velazquez Female    DOB: [birth year]   53 y.o.   MRN: [id number] Visit Date: 06/28/2018  Today's Provider: Trey Sailors, PA-C   Chief Complaint  Patient presents with  . Sinusitis   Subjective:     Sinusitis  This is a new problem. The current episode started in the past 7 days (3 days). Associated symptoms include headaches. Pertinent negatives include no chills, congestion, coughing, diaphoresis, ear pain, hoarse voice, neck pain, shortness of breath, sinus pressure, sneezing, sore throat or swollen glands.   Patient states she went to the dentist yesterday and they did an x-ray. Patient states they told her her sinuses are inflamed. Patient states the only symptoms she has is a mild headache, that she's had for 3 days. Denies fevers, chills, facial pain, rhinorrhea. She does report a longstanding history of congestion since she was a young child. She    Allergies  Allergen Reactions  . Fentanyl Shortness Of Breath  . Codeine Rash     Current Outpatient Medications:  .  levothyroxine (SYNTHROID, LEVOTHROID) 50 MCG tablet, Take 50 mcg by mouth daily., Disp: , Rfl: 4 .  meclizine (ANTIVERT) 25 MG tablet, Take 1 tablet (25 mg total) by mouth 2 (two) times daily as needed for dizziness. Take it scheduled for 3 days and then as needed for dizziness, Disp: 20 tablet, Rfl: 0 .  naproxen (NAPROSYN) 500 MG tablet, Take 1 tablet (500 mg total) by mouth 2 (two) times daily with a meal., Disp: 60 tablet, Rfl: 2 .  valACYclovir (VALTREX) 500 MG tablet, Take 500 mg by mouth at bedtime., Disp: , Rfl:  .  atorvastatin (LIPITOR) 10 MG tablet, Take 10 mg by mouth daily., Disp: , Rfl:  .  fluticasone (FLONASE) 50 MCG/ACT nasal spray, Place 2 sprays into both nostrils daily. (Patient not taking: Reported on 06/28/2018), Disp: 48 g, Rfl: 3  Review of Systems  Constitutional: Negative for appetite change, chills, diaphoresis, fatigue and fever.  HENT: Negative for congestion,  ear pain, hoarse voice, sinus pressure, sneezing and sore throat.   Respiratory: Negative for cough, chest tightness and shortness of breath.   Cardiovascular: Negative for chest pain and palpitations.  Gastrointestinal: Negative for abdominal pain, nausea and vomiting.  Musculoskeletal: Negative for neck pain.  Neurological: Positive for headaches. Negative for dizziness and weakness.    Social History   Tobacco Use  . Smoking status: Never Smoker  . Smokeless tobacco: Never Used  Substance Use Topics  . Alcohol use: No    Alcohol/week: 0.0 standard drinks      Objective:   BP 115/73 (BP Location: Left Arm, Patient Position: Sitting, Cuff Size: Large)   Pulse (!) 105   Temp 98.3 F (36.8 C) (Oral)   Wt 150 lb (68 kg)   LMP 03/20/2016   SpO2 93%   BMI 24.21 kg/m  Vitals:   06/28/18 1009  BP: 115/73  Pulse: (!) 105  Temp: 98.3 F (36.8 C)  TempSrc: Oral  SpO2: 93%  Weight: 150 lb (68 kg)     Physical Exam Constitutional:      Appearance: She is well-developed.  HENT:     Right Ear: External ear normal.     Left Ear: External ear normal.     Mouth/Throat:     Pharynx: No oropharyngeal exudate.  Eyes:     General:        Right eye: Discharge present.  Left eye: Discharge present. Neck:     Musculoskeletal: Neck supple.  Cardiovascular:     Rate and Rhythm: Normal rate and regular rhythm.  Pulmonary:     Effort: Pulmonary effort is normal. No respiratory distress.     Breath sounds: Normal breath sounds. No rales.  Lymphadenopathy:     Cervical: No cervical adenopathy.  Skin:    General: Skin is warm and dry.  Psychiatric:        Behavior: Behavior normal.         Assessment & Plan    1. Acute non-recurrent frontal sinusitis  Counseled patient that Xrays can show inflammation but cannot determine the cause. It sounds like she has chronic congestion which could very well lead to chronic inflammation of her sinuses but at this point no  indication for antibiotic for bacterial sinusitis. She is not currently taking a 2nd gen antihistamine so I have recommended xyzal once daily. She says flonase made her lose her sense of smell and taste, counseled this is more likely due to chronic congestion but she does not want nasal steroid and so she can use Rx as below. If worsening after 7 days may call back for abx.  - azelastine (ASTELIN) 0.1 % nasal spray; Place 1 spray into both nostrils 2 (two) times daily. Use in each nostril as directed  Dispense: 30 mL; Refill: 12  Return if symptoms worsen or fail to improve.  The entirety of the information documented in the History of Present Illness, Review of Systems and Physical Exam were personally obtained by me. Portions of this information were initially documented by April M. Hyacinth Meeker, CMA and reviewed by me for thoroughness and accuracy.           Trey Sailors, PA-C  [facility]

## 2018-06-28 NOTE — Patient Instructions (Addendum)
Xyzal one pill daily  Sudafed for 3 days Call back in one week and we will send antibiotic    Sinusitis, Adult Sinusitis is soreness and swelling (inflammation) of your sinuses. Sinuses are hollow spaces in the bones around your face. They are located:  Around your eyes.  In the middle of your forehead.  Behind your nose.  In your cheekbones. Your sinuses and nasal passages are lined with a fluid called mucus. Mucus drains out of your sinuses. Swelling can trap mucus in your sinuses. This lets germs (bacteria, virus, or fungus) grow, which leads to infection. Most of the time, this condition is caused by a virus. What are the causes? This condition is caused by:  Allergies.  Asthma.  Germs.  Things that block your nose or sinuses.  Growths in the nose (nasal polyps).  Chemicals or irritants in the air.  Fungus (rare). What increases the risk? You are more likely to develop this condition if:  You have a weak body defense system (immune system).  You do a lot of swimming or diving.  You use nasal sprays too much.  You smoke. What are the signs or symptoms? The main symptoms of this condition are pain and a feeling of pressure around the sinuses. Other symptoms include:  Stuffy nose (congestion).  Runny nose (drainage).  Swelling and warmth in the sinuses.  Headache.  Toothache.  A cough that may get worse at night.  Mucus that collects in the throat or the back of the nose (postnasal drip).  Being unable to smell and taste.  Being very tired (fatigue).  A fever.  Sore throat.  Bad breath. How is this diagnosed? This condition is diagnosed based on:  Your symptoms.  Your medical history.  A physical exam.  Tests to find out if your condition is short-term (acute) or long-term (chronic). Your doctor may: ? Check your nose for growths (polyps). ? Check your sinuses using a tool that has a light (endoscope). ? Check for allergies or  germs. ? Do imaging tests, such as an MRI or CT scan. How is this treated? Treatment for this condition depends on the cause and whether it is short-term or long-term.  If caused by a virus, your symptoms should go away on their own within 10 days. You may be given medicines to relieve symptoms. They include: ? Medicines that shrink swollen tissue in the nose. ? Medicines that treat allergies (antihistamines). ? A spray that treats swelling of the nostrils. ? Rinses that help get rid of thick mucus in your nose (nasal saline washes).  If caused by bacteria, your doctor may wait to see if you will get better without treatment. You may be given antibiotic medicine if you have: ? A very bad infection. ? A weak body defense system.  If caused by growths in the nose, you may need to have surgery. Follow these instructions at home: Medicines  Take, use, or apply over-the-counter and prescription medicines only as told by your doctor. These may include nasal sprays.  If you were prescribed an antibiotic medicine, take it as told by your doctor. Do not stop taking the antibiotic even if you start to feel better. Hydrate and humidify   Drink enough water to keep your pee (urine) pale yellow.  Use a cool mist humidifier to keep the humidity level in your home above 50%.  Breathe in steam for 10-15 minutes, 3-4 times a day, or as told by your doctor. You can  do this in the bathroom while a hot shower is running.  Try not to spend time in cool or dry air. Rest  Rest as much as you can.  Sleep with your head raised (elevated).  Make sure you get enough sleep each night. General instructions   Put a warm, moist washcloth on your face 3-4 times a day, or as often as told by your doctor. This will help with discomfort.  Wash your hands often with soap and water. If there is no soap and water, use hand sanitizer.  Do not smoke. Avoid being around people who are smoking (secondhand  smoke).  Keep all follow-up visits as told by your doctor. This is important. Contact a doctor if:  You have a fever.  Your symptoms get worse.  Your symptoms do not get better within 10 days. Get help right away if:  You have a very bad headache.  You cannot stop throwing up (vomiting).  You have very bad pain or swelling around your face or eyes.  You have trouble seeing.  You feel confused.  Your neck is stiff.  You have trouble breathing. Summary  Sinusitis is swelling of your sinuses. Sinuses are hollow spaces in the bones around your face.  This condition is caused by tissues in your nose that become inflamed or swollen. This traps germs. These can lead to infection.  If you were prescribed an antibiotic medicine, take it as told by your doctor. Do not stop taking it even if you start to feel better.  Keep all follow-up visits as told by your doctor. This is important. This information is not intended to replace advice given to you by your health care provider. Make sure you discuss any questions you have with your health care provider. Document Released: 10/25/2007 Document Revised: 10/08/2017 Document Reviewed: 10/08/2017 Elsevier Interactive Patient Education  2019 [employer].

## 2018-08-02 ENCOUNTER — Encounter: Payer: Self-pay | Admitting: Physician Assistant

## 2018-08-02 ENCOUNTER — Ambulatory Visit (INDEPENDENT_AMBULATORY_CARE_PROVIDER_SITE_OTHER): Payer: BC Managed Care – PPO | Admitting: Physician Assistant

## 2018-08-02 ENCOUNTER — Other Ambulatory Visit: Payer: Self-pay

## 2018-08-02 VITALS — BP 122/83 | HR 71 | Temp 97.8°F | Resp 16 | Wt 159.0 lb

## 2018-08-02 DIAGNOSIS — M542 Cervicalgia: Secondary | ICD-10-CM

## 2018-08-02 MED ORDER — NAPROXEN 500 MG PO TABS: 500.0000 mg | ORAL_TABLET | Freq: Two times a day (BID) | ORAL | 2 refills | Status: AC

## 2018-08-02 MED ORDER — CYCLOBENZAPRINE HCL 5 MG PO TABS: 5.0000 mg | ORAL_TABLET | Freq: Three times a day (TID) | ORAL | 1 refills | Status: AC | PRN

## 2018-08-02 MED ORDER — PREDNISONE 20 MG PO TABS: 20.0000 mg | ORAL_TABLET | Freq: Every day | ORAL | 0 refills | Status: AC

## 2018-08-02 NOTE — Patient Instructions (Signed)
Viral Meningitis, Adult Viral meningitis is an infection of the tissues (meninges) that cover the brain and spinal cord. Many common viruses can cause viral meningitis. Most people with viral meningitis get better without treatment in about 10 days. However, it is important to be evaluated by your health care provider to make sure you do not have bacterial meningitis. Bacterial meningitis has similar symptoms, but it is much more dangerous and must be treated quickly with antibiotics. What are the causes? Many common viruses can cause viral meningitis, including:  Enteroviruses. These types of viruses are the most common cause of viral meningitis.  Herpes.  HIV (human immunodeficiency virus).  Measles.  Mumps.  Chicken pox (varicella-zoster).  Flu (influenza) viruses. These viruses can be spread in different ways, such as through contact with:  Stool. This means that you could get sick by touching something that has been contaminated with infected stool and then touching your eyes, nose, or mouth.  Respiratory secretions. This means that you could get sick from coughs or sneezes of an infected person, similar to the spreading of the common cold.  Infected blood or infected bodily fluids.  Rodents.  Mosquito bites or tick bites. When a virus enters your system, it can spread through the blood to reach the brain and spinal cord. What increases the risk? You may be at higher risk for meningitis if you have a weakened disease-fighting system (immune system). What are the signs or symptoms? Symptoms of viral meningitis may be similar to symptoms of a cold or flu. Signs and symptoms may include:  Fever.  Headache.  Stiff neck.  Muscle aches.  Nausea and vomiting.  Sensitivity to light.  Tiredness.  Cough. How is this diagnosed? This condition may be diagnosed based on your symptoms, your medical history, and a physical exam. You may be asked to touch your chin to your  neck to see if this causes pain. You may have tests, such as:  Lumbar puncture. In this procedure, also called a spinal tap, a small amount of fluid from your spinal canal (cerebrospinal fluid) is removed and analyzed.  Blood tests.  Other fluid or tissue samples.  CT scan.  MRI. How is this treated? Most types of viral meningitis go away without treatment. You may be given antibiotic medicine through an IV tube until your health care provider is sure that you do not have bacterial meningitis. The antibiotic will be stopped as soon as viral meningitis is diagnosed. Depending on the type of virus that caused your meningitis, you may be given:  Antiretroviral medicine.  Antiviral medicine.  Medicines that reduce fever and pain.  Medicines that reduce swelling (steroids). Follow these instructions at home:  Take over-the-counter and prescription medicines only as told by your health care provider.  If you are taking a medicine for viral meningitis, do not stop taking the medicine even if you start to feel better.  Drink enough fluid to keep your urine clear or pale yellow.  Rest at home until you feel better. Return to your normal activities as told by your health care provider.  Keep all follow-up visits as told by your health care provider. This is important. How is this prevented?  Get a flu shot (influenza vaccination) every year. This will help prevent meningitis that is caused by flu viruses.  Wash your hands often with soap and water. If soap and water are not available, use hand sanitizer.  Avoid touching your hands to your face when you have   not washed your hands recently.  Avoid close contact with people who are sick.  Disinfect counters and other surfaces if someone in your home is sick.  Stay home while you are sick, and try to stay away from others as much as possible to avoid spreading the infection.  Cover your nose and mouth when you sneeze or cough.  Use  insect repellent to prevent mosquito bites. Contact a health care provider if:  Your symptoms do not improve after 7-10 days.  You have a fever that does not get better with medicine. Get help right away if:  Your symptoms get worse.  You become confused.  You become very sleepy. This information is not intended to replace advice given to you by your health care provider. Make sure you discuss any questions you have with your health care provider. Document Released: 08/30/2015 Document Revised: 10/14/2015 Document Reviewed: 07/12/2015 Elsevier Interactive Patient Education  2019 [employer].

## 2018-08-02 NOTE — Progress Notes (Signed)
Patient: Brittany Velazquez Female    DOB: 1965/10/01   53 y.o.   MRN: [id number] Visit Date: 08/02/2018  Today's Provider: Margaretann Loveless, PA-C   Chief Complaint  Patient presents with  . Neck Pain   Subjective:    I, Sulibeya S. Dimas, CMA, am acting as a Neurosurgeon for [employer], PA-C.   HPI Patient here today c/o neck pain started today. Patient reports pain radiates through the back of her head around to the top. Patient reports pain is worse with movement.  Patient reports taking prednisone today. Patient denies any fever, cough or runny nose.   Allergies  Allergen Reactions  . Fentanyl Shortness Of Breath  . Codeine Rash     Current Outpatient Medications:  .  atorvastatin (LIPITOR) 10 MG tablet, Take 10 mg by mouth daily., Disp: , Rfl:  .  azelastine (ASTELIN) 0.1 % nasal spray, Place 1 spray into both nostrils 2 (two) times daily. Use in each nostril as directed, Disp: 30 mL, Rfl: 12 .  levothyroxine (SYNTHROID, LEVOTHROID) 50 MCG tablet, Take 50 mcg by mouth daily., Disp: , Rfl: 4 .  meclizine (ANTIVERT) 25 MG tablet, Take 1 tablet (25 mg total) by mouth 2 (two) times daily as needed for dizziness. Take it scheduled for 3 days and then as needed for dizziness, Disp: 20 tablet, Rfl: 0 .  fluticasone (FLONASE) 50 MCG/ACT nasal spray, Place 2 sprays into both nostrils daily. (Patient not taking: Reported on 06/28/2018), Disp: 48 g, Rfl: 3 .  naproxen (NAPROSYN) 500 MG tablet, Take 1 tablet (500 mg total) by mouth 2 (two) times daily with a meal. (Patient not taking: Reported on 08/02/2018), Disp: 60 tablet, Rfl: 2 .  valACYclovir (VALTREX) 500 MG tablet, Take 500 mg by mouth at bedtime., Disp: , Rfl:   Review of Systems  Constitutional: Negative.   HENT: Positive for tinnitus.   Respiratory: Negative.   Cardiovascular: Negative.   Gastrointestinal: Negative.   Musculoskeletal: Positive for myalgias, neck pain and neck stiffness. Negative for back pain and gait  problem.  Neurological: Positive for dizziness, light-headedness and headaches. Negative for weakness and numbness.    Social History   Tobacco Use  . Smoking status: Never Smoker  . Smokeless tobacco: Never Used  Substance Use Topics  . Alcohol use: No    Alcohol/week: 0.0 standard drinks      Objective:   BP 122/83 (BP Location: Left Arm, Patient Position: Sitting, Cuff Size: Normal)   Pulse 71   Temp 97.8 F (36.6 C) (Oral)   Resp 16   Wt 159 lb (72.1 kg)   LMP 03/20/2016   BMI 25.66 kg/m  Vitals:   08/02/18 1821  BP: 122/83  Pulse: 71  Resp: 16  Temp: 97.8 F (36.6 C)  TempSrc: Oral  Weight: 159 lb (72.1 kg)     Physical Exam Vitals signs reviewed.  Constitutional:      General: She is not in acute distress.    Appearance: Normal appearance. She is well-developed. She is not ill-appearing or diaphoretic.  HENT:     Head: Normocephalic and atraumatic.     Right Ear: Hearing, tympanic membrane, ear canal and external ear normal. There is no impacted cerumen.     Left Ear: Hearing, tympanic membrane, ear canal and external ear normal. There is no impacted cerumen.     Nose: Nose normal.     Mouth/Throat:     Mouth: Mucous membranes are  moist.     Pharynx: Uvula midline. No oropharyngeal exudate.  Eyes:     General: No scleral icterus.       Right eye: No discharge.        Left eye: No discharge.     Extraocular Movements: Extraocular movements intact.     Conjunctiva/sclera: Conjunctivae normal.     Pupils: Pupils are equal, round, and reactive to light.  Neck:     Musculoskeletal: Neck supple. Decreased range of motion. Pain with movement and muscular tenderness present. No neck rigidity, torticollis or spinous process tenderness.     Thyroid: No thyromegaly.     Trachea: No tracheal deviation.  Cardiovascular:     Rate and Rhythm: Normal rate and regular rhythm.     Heart sounds: Normal heart sounds. No murmur. No friction rub. No gallop.    Pulmonary:     Effort: Pulmonary effort is normal. No respiratory distress.     Breath sounds: Normal breath sounds. No stridor. No wheezing or rales.  Lymphadenopathy:     Cervical: No cervical adenopathy.  Skin:    General: Skin is warm and dry.  Neurological:     Mental Status: She is alert.         Assessment & Plan    1. Cervical spine pain Will treat with prednisone and flexeril as below. Once prednisone is completed she is to transition to naproxen and continue flexeril prn. Moist heat. Light stretches. Massage. Call if worsening.  - cyclobenzaprine (FLEXERIL) 5 MG tablet; Take 1 tablet (5 mg total) by mouth 3 (three) times daily as needed for muscle spasms.  Dispense: 30 tablet; Refill: 1 - naproxen (NAPROSYN) 500 MG tablet; Take 1 tablet (500 mg total) by mouth 2 (two) times daily with a meal.  Dispense: 60 tablet; Refill: 2 - predniSONE (DELTASONE) 20 MG tablet; Take 1 tablet (20 mg total) by mouth daily with breakfast.  Dispense: 10 tablet; Refill: 0     Margaretann Loveless, PA-C  [facility]

## 2018-08-30 IMAGING — DX DG THORACIC SPINE 2V
3 series · 3 of 3 positions shown · non-contrast
Comparison: Thoracic spine series of February 16, 2014

CLINICAL DATA: Midthoracic pain after a pulling episode on June 28, 2016

EXAM:
THORACIC SPINE 2 VIEWS

[t-spine ap]
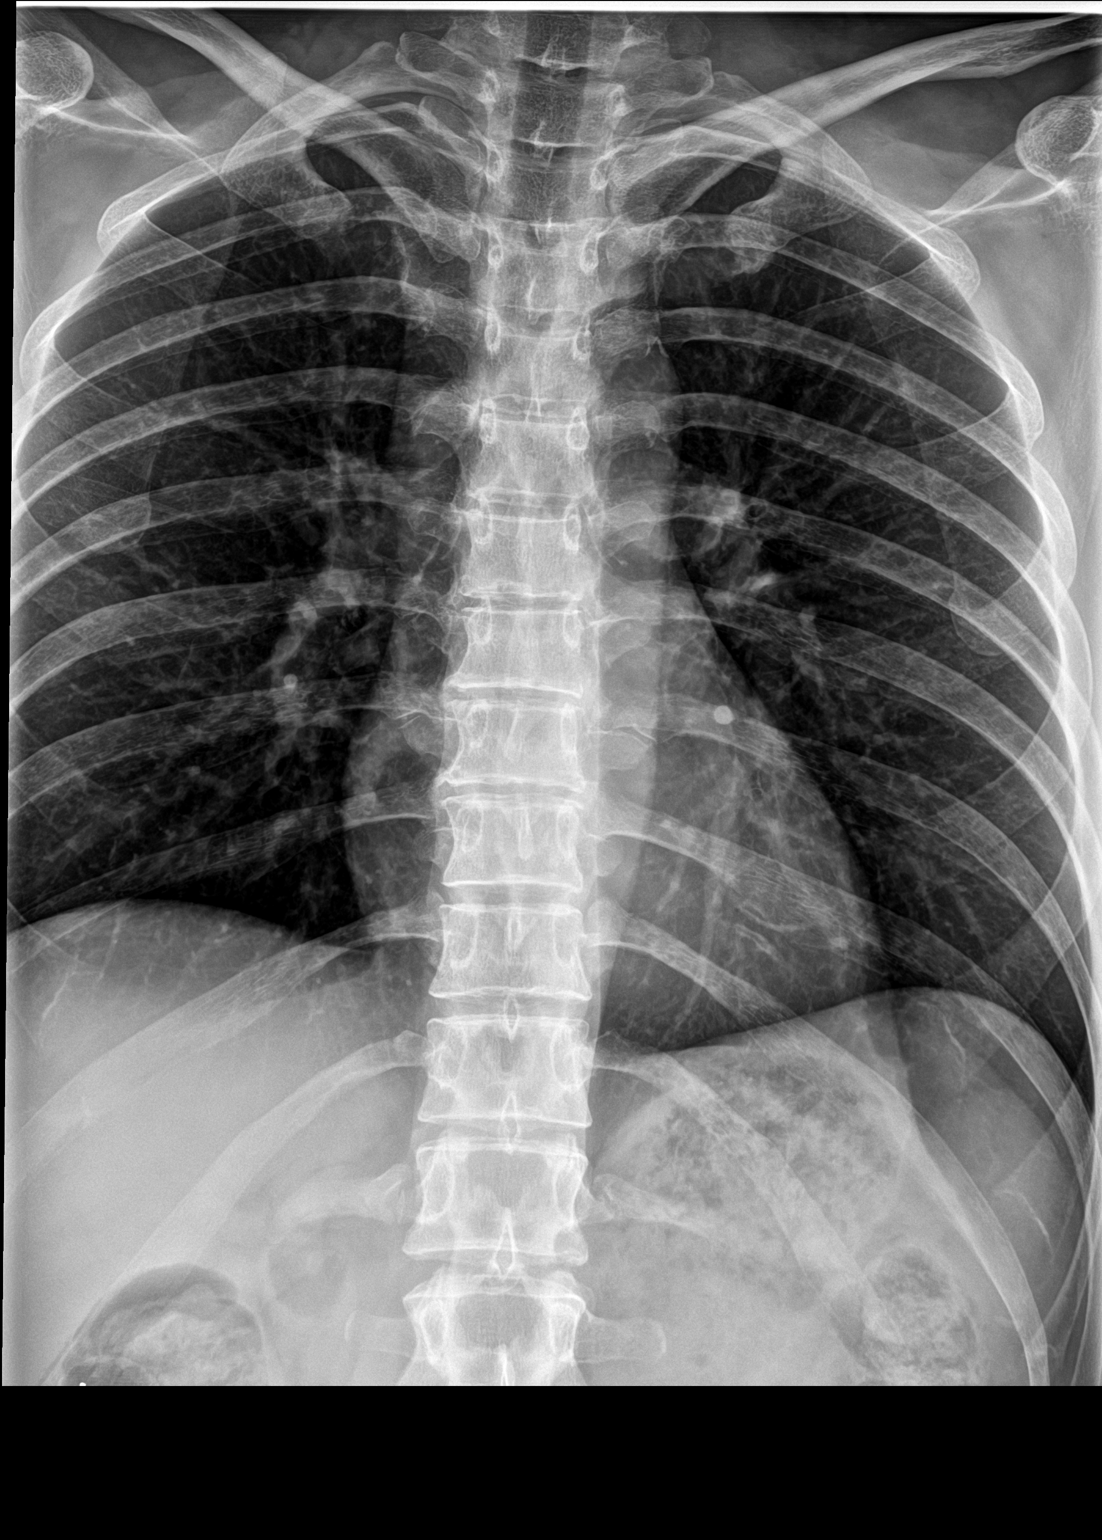

[t-spine lat (1 of 2)]
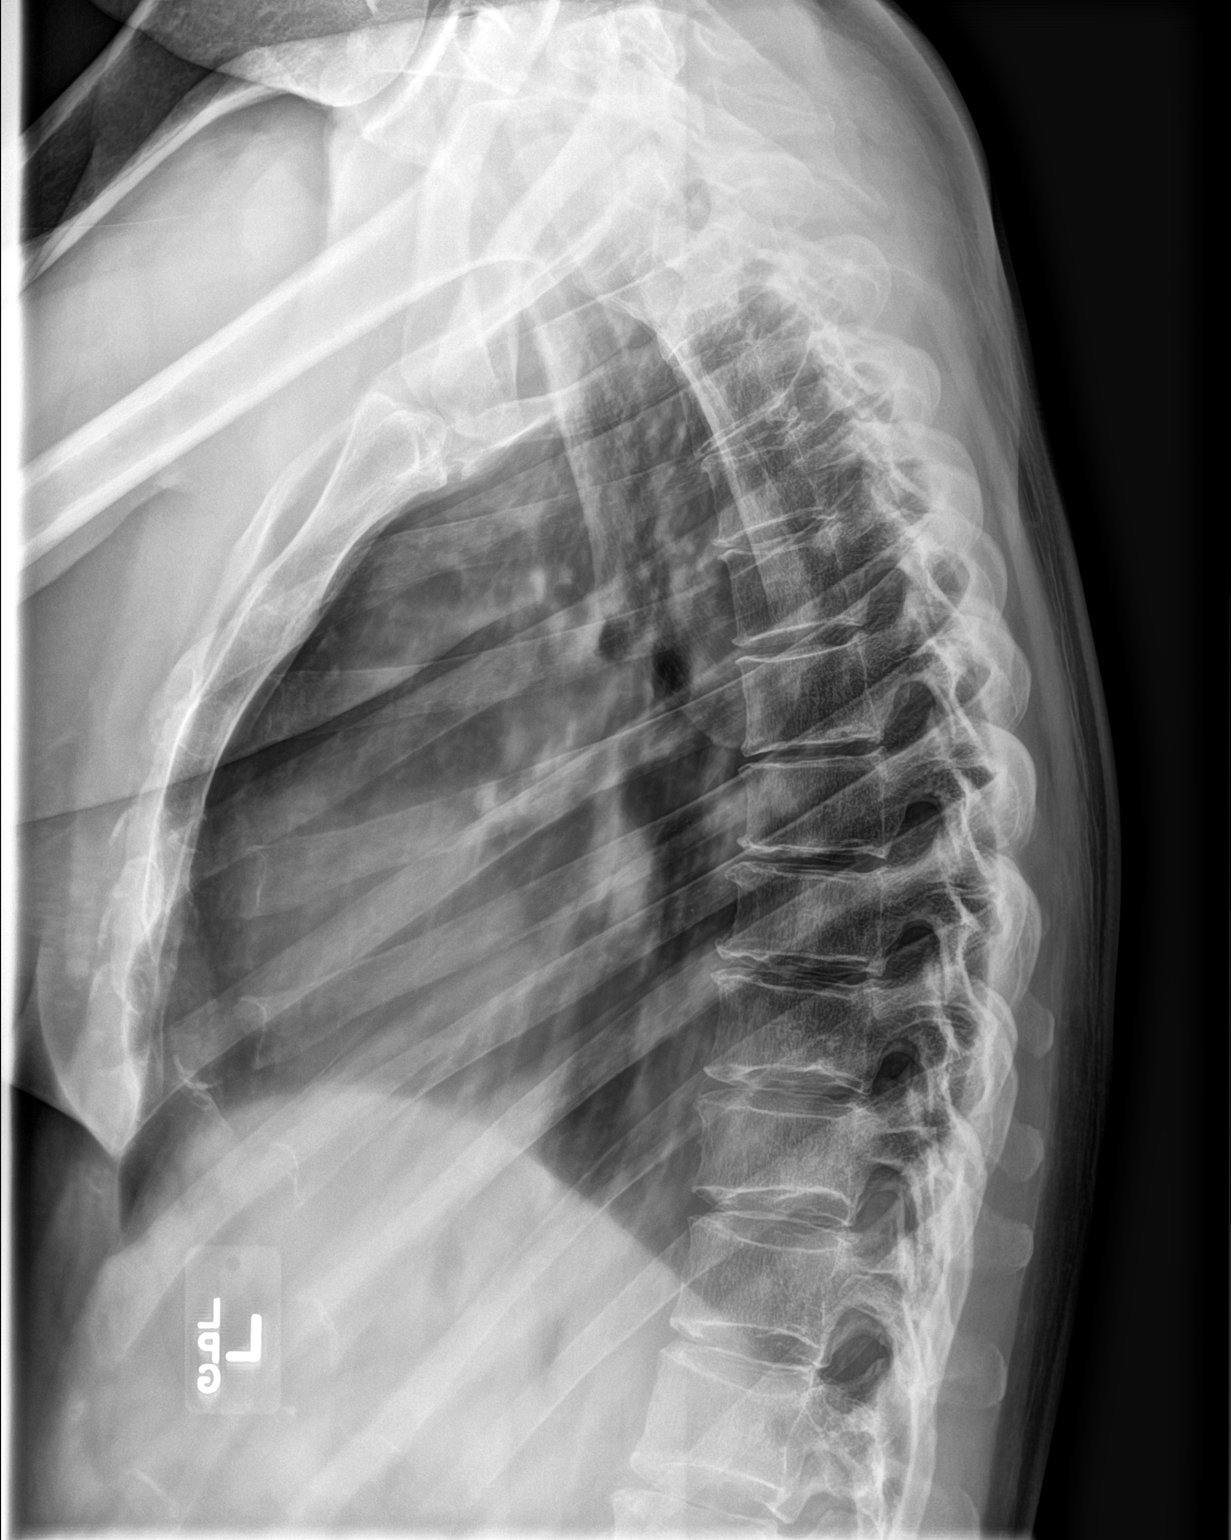

[t-spine lat (2 of 2)]
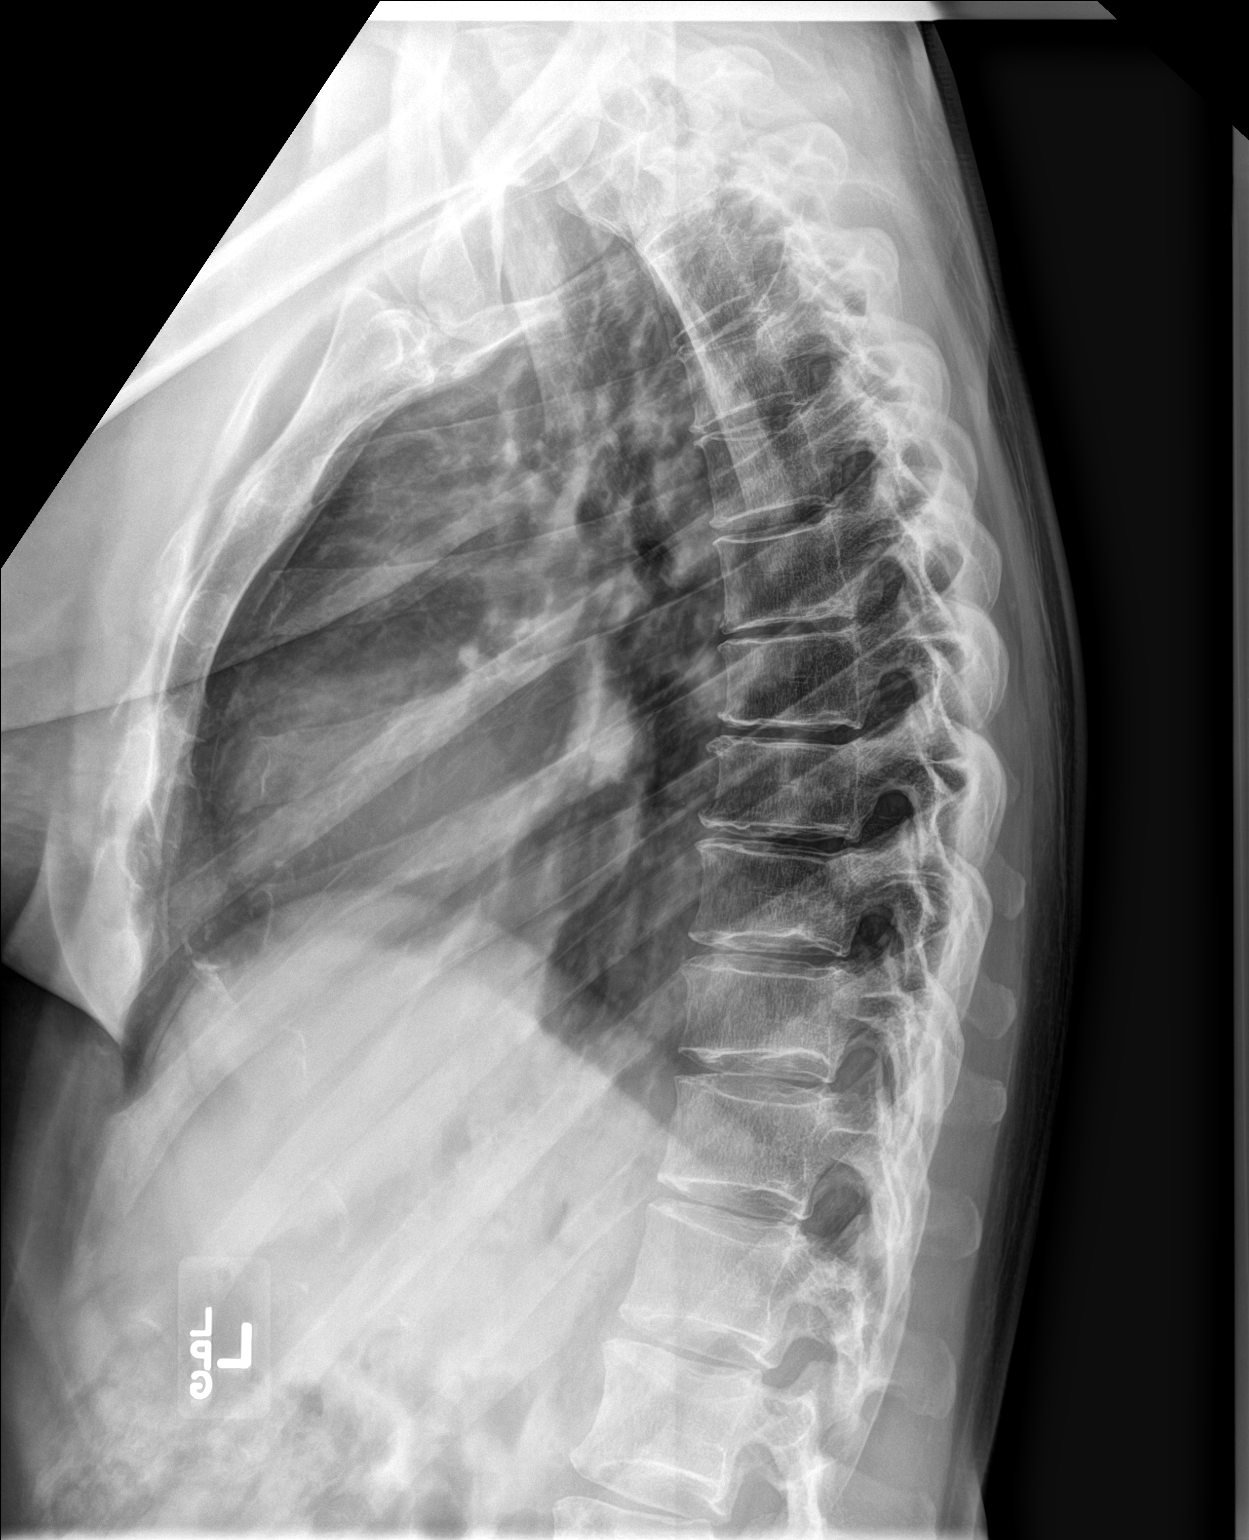

[3 of 3 positions shown; findings below may reference images not displayed]

FINDINGS: The thoracic vertebral bodies are preserved in height. The disc
space heights are well maintained. The pedicles are intact. There
are no abnormal soft tissue densities. There tiny endplate
osteophytes noted in the mid and lower thoracic spine.
IMPRESSION: There is no acute abnormality of the thoracic spine.

## 2018-10-17 DIAGNOSIS — E7800 Pure hypercholesterolemia, unspecified: Secondary | ICD-10-CM | POA: Insufficient documentation

## 2019-03-10 ENCOUNTER — Other Ambulatory Visit: Payer: Self-pay

## 2019-03-10 ENCOUNTER — Ambulatory Visit (INDEPENDENT_AMBULATORY_CARE_PROVIDER_SITE_OTHER): Payer: BC Managed Care – PPO | Admitting: Family Medicine

## 2019-03-10 ENCOUNTER — Encounter: Payer: Self-pay | Admitting: Family Medicine

## 2019-03-10 VITALS — Temp 97.2°F | Wt 142.0 lb

## 2019-03-10 DIAGNOSIS — J0141 Acute recurrent pansinusitis: Secondary | ICD-10-CM

## 2019-03-10 MED ORDER — DOXYCYCLINE HYCLATE 100 MG PO TABS: 100.0000 mg | ORAL_TABLET | Freq: Two times a day (BID) | ORAL | 0 refills | Status: AC

## 2019-03-10 NOTE — Patient Instructions (Signed)
Sinusitis, Adult Sinusitis is inflammation of your sinuses. Sinuses are hollow spaces in the bones around your face. Your sinuses are located:  Around your eyes.  In the middle of your forehead.  Behind your nose.  In your cheekbones. Mucus normally drains out of your sinuses. When your nasal tissues become inflamed or swollen, mucus can become trapped or blocked. This allows bacteria, viruses, and fungi to grow, which leads to infection. Most infections of the sinuses are caused by a virus. Sinusitis can develop quickly. It can last for up to 4 weeks (acute) or for more than 12 weeks (chronic). Sinusitis often develops after a cold. What are the causes? This condition is caused by anything that creates swelling in the sinuses or stops mucus from draining. This includes:  Allergies.  Asthma.  Infection from bacteria or viruses.  Deformities or blockages in your nose or sinuses.  Abnormal growths in the nose (nasal polyps).  Pollutants, such as chemicals or irritants in the air.  Infection from fungi (rare). What increases the risk? You are more likely to develop this condition if you:  Have a weak body defense system (immune system).  Do a lot of swimming or diving.  Overuse nasal sprays.  Smoke. What are the signs or symptoms? The main symptoms of this condition are pain and a feeling of pressure around the affected sinuses. Other symptoms include:  Stuffy nose or congestion.  Thick drainage from your nose.  Swelling and warmth over the affected sinuses.  Headache.  Upper toothache.  A cough that may get worse at night.  Extra mucus that collects in the throat or the back of the nose (postnasal drip).  Decreased sense of smell and taste.  Fatigue.  A fever.  Sore throat.  Bad breath. How is this diagnosed? This condition is diagnosed based on:  Your symptoms.  Your medical history.  A physical exam.  Tests to find out if your condition is  acute or chronic. This may include: ? Checking your nose for nasal polyps. ? Viewing your sinuses using a device that has a light (endoscope). ? Testing for allergies or bacteria. ? Imaging tests, such as an MRI or CT scan. In rare cases, a bone biopsy may be done to rule out more serious types of fungal sinus disease. How is this treated? Treatment for sinusitis depends on the cause and whether your condition is chronic or acute.  If caused by a virus, your symptoms should go away on their own within 10 days. You may be given medicines to relieve symptoms. They include: ? Medicines that shrink swollen nasal passages (topical intranasal decongestants). ? Medicines that treat allergies (antihistamines). ? A spray that eases inflammation of the nostrils (topical intranasal corticosteroids). ? Rinses that help get rid of thick mucus in your nose (nasal saline washes).  If caused by bacteria, your health care provider may recommend waiting to see if your symptoms improve. Most bacterial infections will get better without antibiotic medicine. You may be given antibiotics if you have: ? A severe infection. ? A weak immune system.  If caused by narrow nasal passages or nasal polyps, you may need to have surgery. Follow these instructions at home: Medicines  Take, use, or apply over-the-counter and prescription medicines only as told by your health care provider. These may include nasal sprays.  If you were prescribed an antibiotic medicine, take it as told by your health care provider. Do not stop taking the antibiotic even if you start   to feel better. Hydrate and humidify   Drink enough fluid to keep your urine pale yellow. Staying hydrated will help to thin your mucus.  Use a cool mist humidifier to keep the humidity level in your home above 50%.  Inhale steam for 10-15 minutes, 3-4 times a day, or as told by your health care provider. You can do this in the bathroom while a hot shower is  running.  Limit your exposure to cool or dry air. Rest  Rest as much as possible.  Sleep with your head raised (elevated).  Make sure you get enough sleep each night. General instructions   Apply a warm, moist washcloth to your face 3-4 times a day or as told by your health care provider. This will help with discomfort.  Wash your hands often with soap and water to reduce your exposure to germs. If soap and water are not available, use hand sanitizer.  Do not smoke. Avoid being around people who are smoking (secondhand smoke).  Keep all follow-up visits as told by your health care provider. This is important. Contact a health care provider if:  You have a fever.  Your symptoms get worse.  Your symptoms do not improve within 10 days. Get help right away if:  You have a severe headache.  You have persistent vomiting.  You have severe pain or swelling around your face or eyes.  You have vision problems.  You develop confusion.  Your neck is stiff.  You have trouble breathing. Summary  Sinusitis is soreness and inflammation of your sinuses. Sinuses are hollow spaces in the bones around your face.  This condition is caused by nasal tissues that become inflamed or swollen. The swelling traps or blocks the flow of mucus. This allows bacteria, viruses, and fungi to grow, which leads to infection.  If you were prescribed an antibiotic medicine, take it as told by your health care provider. Do not stop taking the antibiotic even if you start to feel better.  Keep all follow-up visits as told by your health care provider. This is important. This information is not intended to replace advice given to you by your health care provider. Make sure you discuss any questions you have with your health care provider. Document Released: 05/08/2005 Document Revised: 10/08/2017 Document Reviewed: 10/08/2017 Elsevier Patient Education  2020 [employer]  

## 2019-03-10 NOTE — Progress Notes (Signed)
Patient: Brittany Velazquez Female    DOB: 1965-10-15   52 y.o.   MRN: [id number] Visit Date: 03/10/2019  Today's Provider: Lavon Paganini, MD   Chief Complaint  Patient presents with  . Sinusitis   Subjective:    Virtual Visit via Video Note I connected with Brittany Velazquez on 03/10/19 at  9:40 AM EDT by a video enabled telemedicine application and verified that I am speaking with the correct person using two identifiers.   Patient location: home Provider location: [place] involved in the visit: patient, provider   I discussed the limitations of evaluation and management by telemedicine and the availability of in person appointments. The patient expressed understanding and agreed to proceed.  Interactive audio and video communications were attempted, although failed due to patient's inability to connect to video. Continued visit with audio only interaction with patient agreement.    Sinusitis This is a new problem. The current episode started 1 to 4 weeks ago (Started about two weeks ago.). The problem has been gradually worsening since onset. There has been no fever. Associated symptoms include congestion, ear pain, headaches and sinus pressure. Pertinent negatives include no chills, coughing, hoarse voice, shortness of breath, sneezing or sore throat. Past treatments include spray decongestants. The treatment provided mild relief.   Started with ear pain and progressed to sinus pain and pressure.  No nasal drainage. Pain is similar to previous sinus infections.    Taking meclizine for vertigo.  Helps.  Azelastine is helping some, but her symptoms are worsening.  Allergies  Allergen Reactions  . Fentanyl Shortness Of Breath  . Codeine Rash     Current Outpatient Medications:  .  atorvastatin (LIPITOR) 10 MG tablet, Take 10 mg by mouth daily., Disp: , Rfl:  .  azelastine (ASTELIN) 0.1 % nasal spray, Place 1 spray into both nostrils 2 (two)  times daily. Use in each nostril as directed, Disp: 30 mL, Rfl: 12 .  cyclobenzaprine (FLEXERIL) 5 MG tablet, Take 1 tablet (5 mg total) by mouth 3 (three) times daily as needed for muscle spasms., Disp: 30 tablet, Rfl: 1 .  levothyroxine (SYNTHROID, LEVOTHROID) 50 MCG tablet, Take 50 mcg by mouth daily., Disp: , Rfl: 4 .  meclizine (ANTIVERT) 25 MG tablet, Take 1 tablet (25 mg total) by mouth 2 (two) times daily as needed for dizziness. Take it scheduled for 3 days and then as needed for dizziness, Disp: 20 tablet, Rfl: 0 .  naproxen (NAPROSYN) 500 MG tablet, Take 1 tablet (500 mg total) by mouth 2 (two) times daily with a meal., Disp: 60 tablet, Rfl: 2 .  valACYclovir (VALTREX) 500 MG tablet, Take 500 mg by mouth at bedtime., Disp: , Rfl:  .  doxycycline (VIBRA-TABS) 100 MG tablet, Take 1 tablet (100 mg total) by mouth 2 (two) times daily for 7 days., Disp: 14 tablet, Rfl: 0 .  fluticasone (FLONASE) 50 MCG/ACT nasal spray, Place 2 sprays into both nostrils daily. (Patient not taking: Reported on 06/28/2018), Disp: 48 g, Rfl: 3 .  predniSONE (DELTASONE) 20 MG tablet, Take 1 tablet (20 mg total) by mouth daily with breakfast. (Patient not taking: Reported on 03/10/2019), Disp: 10 tablet, Rfl: 0  Review of Systems  Constitutional: Negative.  Negative for chills.  HENT: Positive for congestion, ear pain, postnasal drip, sinus pressure, sinus pain and tinnitus. Negative for ear discharge, hoarse voice, rhinorrhea, sneezing, sore throat, trouble swallowing and voice change.   Eyes: Negative.  Respiratory: Negative.  Negative for cough and shortness of breath.   Gastrointestinal: Negative.   Neurological: Positive for dizziness and headaches. Negative for light-headedness.    Social History   Tobacco Use  . Smoking status: Never Smoker  . Smokeless tobacco: Never Used  Substance Use Topics  . Alcohol use: No    Alcohol/week: 0.0 standard drinks      Objective:   Temp (!) 97.2 F (36.2 C)  (Temporal)   Wt 142 lb (64.4 kg)   LMP 03/20/2016   BMI 22.92 kg/m  Vitals:   03/10/19 0816  Temp: (!) 97.2 F (36.2 C)  TempSrc: Temporal  Weight: 142 lb (64.4 kg)  Body mass index is 22.92 kg/m.   Physical Exam Speaking in full sentences with NAD and no signs of respiratory distress  No results found for any visits on 03/10/19.     Assessment & Plan   Follow Up Instructions: I discussed the assessment and treatment plan with the patient. The patient was provided an opportunity to ask questions and all were answered. The patient agreed with the plan and demonstrated an understanding of the instructions.   The patient was advised to call back or seek an in-person evaluation if the symptoms worsen or if the condition fails to improve as anticipated.  1. Acute recurrent pansinusitis - symptoms c/w sinusitis   - no symptoms of CAP, strep pharyngitis, or other infection - given duration of symptoms, suspect bacterial etiology - will treat with Doxycycline x7d - discussed symptomatic management (flonase, decongestants, etc), natural course, and return precautions     Meds ordered this encounter  Medications  . doxycycline (VIBRA-TABS) 100 MG tablet    Sig: Take 1 tablet (100 mg total) by mouth 2 (two) times daily for 7 days.    Dispense:  14 tablet    Refill:  0    Will also send ROI to [facility] for last pap smear and Cologuard   Return if symptoms worsen or fail to improve.   The entirety of the information documented in the History of Present Illness, Review of Systems and Physical Exam were personally obtained by me. Portions of this information were initially documented by Kavin Leech, CMA and reviewed by me for thoroughness and accuracy.    Bacigalupo, Marzella Schlein, MD MPH [facility]

## 2019-03-11 ENCOUNTER — Encounter: Payer: Self-pay | Admitting: Family Medicine

## 2019-07-07 LAB — HM MAMMOGRAPHY

## 2019-07-18 ENCOUNTER — Encounter: Payer: Self-pay | Admitting: Family Medicine

## 2019-07-19 ENCOUNTER — Ambulatory Visit: Payer: BC Managed Care – PPO

## 2019-07-24 ENCOUNTER — Other Ambulatory Visit: Payer: Self-pay

## 2019-07-24 ENCOUNTER — Encounter: Payer: Self-pay | Admitting: Podiatry

## 2019-07-24 ENCOUNTER — Ambulatory Visit: Payer: BC Managed Care – PPO | Admitting: Podiatry

## 2019-07-24 VITALS — Temp 97.6°F

## 2019-07-24 DIAGNOSIS — L600 Ingrowing nail: Secondary | ICD-10-CM

## 2019-07-24 NOTE — Progress Notes (Signed)
   Subjective:    Patient ID: Brittany Velazquez, female    DOB: August 12, 1965, 54 y.o.   MRN: [id number]  HPI    Review of Systems  All other systems reviewed and are negative.      Objective:   Physical Exam        Assessment & Plan:

## 2019-07-25 NOTE — Progress Notes (Signed)
Subjective:   Patient ID: Brittany Velazquez, female   DOB: 54 y.o.   MRN: [id number]   HPI Patient presents with deformity of the right hallux nail bed that she dropped something on approximate 3 months ago and states that it is not growing out anymore and she is concerned about the long-term.  States it does not hurt or has not had drainage and she does not smoke and is active   Review of Systems  All other systems reviewed and are negative.       Objective:  Physical Exam Vitals and nursing note reviewed.  Constitutional:      Appearance: She is well-developed.  Pulmonary:     Effort: Pulmonary effort is normal.  Musculoskeletal:        General: Normal range of motion.  Skin:    General: Skin is warm.  Neurological:     Mental Status: She is alert.     Neurovascular status intact muscle strength found to be adequate range of motion within normal limits with patient found to have deformity of the right hallux nail bed with a crack line and a deficit noted secondary to the trauma she experienced in November with no current loss of the nail or looseness of the nailbed or drainage or redness.  Patient has good digital perfusion well oriented x3     Assessment:  Probability for traumatized right hallux nail that may or may not grow out normally over time or may or may not give her pathology pain or drainage F2     Plan:  H&P reviewed condition with patient and treatment options we discussed nail removal temporary nail removal permanent versus leaving it alone and since its not hurting currently and its not loose I do not recommend nail removal but I did explain that may be necessary in future and that at 1 point it may require a permanent procedure.  Patient will be seen back as symptoms indicate

## 2019-07-28 ENCOUNTER — Ambulatory Visit: Payer: BC Managed Care – PPO | Attending: Internal Medicine

## 2019-07-28 DIAGNOSIS — Z23 Encounter for immunization: Secondary | ICD-10-CM

## 2019-07-28 NOTE — Progress Notes (Signed)
   Covid-19 Vaccination Clinic  Name:  SACHA TOPOR    MRN: [id number] DOB: 1965-06-13  07/28/2019  Ms. Mazo was observed post Covid-19 immunization for 30 minutes based on pre-vaccination screening without incident. She was provided with Vaccine Information Sheet and instruction to access the V-Safe system.   Ms. Ellwood was instructed to call 911 with any severe reactions post vaccine: Marland Kitchen Difficulty breathing  . Swelling of face and throat  . A fast heartbeat  . A bad rash all over body  . Dizziness and weakness   Immunizations Administered    Name Date Dose VIS Date Route   Pfizer COVID-19 Vaccine 07/28/2019  4:14 PM 0.3 mL 05/02/2019 Intramuscular   Manufacturer: [employer], [employer]   Lot: [id number]   NDC: [address]-3

## 2019-08-19 ENCOUNTER — Ambulatory Visit: Payer: BC Managed Care – PPO | Attending: Internal Medicine

## 2019-08-19 DIAGNOSIS — Z23 Encounter for immunization: Secondary | ICD-10-CM

## 2019-08-19 NOTE — Progress Notes (Signed)
   Covid-19 Vaccination Clinic  Name:  Brittany Velazquez    MRN: [id number] DOB: 04-14-1966  08/19/2019  [name] was observed post Covid-19 immunization for 30 minutes based on pre-vaccination screening. She complained of feeling hot after being seated which lasted for about 3 mins and then stated she was feeling better. V/S taken which reads, BP: 137/76, HR: 67, O2 sats: 100%, Resp: 20, Temp: 98.5. She stated she felt better and is able to go home. Education done and  She was provided with Vaccine Information Sheet and instruction to access the V-Safe system.   [name] was instructed to call 911 with any severe reactions post vaccine: Marland Kitchen Difficulty breathing  . Swelling of face and throat  . A fast heartbeat  . A bad rash all over body  . Dizziness and weakness   Immunizations Administered    Name Date Dose VIS Date Route   Pfizer COVID-19 Vaccine 08/19/2019  4:35 PM 0.3 mL 05/02/2019 Intramuscular   Manufacturer: [employer], [employer]   Lot: [id number]   NDC: [address]-1

## 2019-10-05 ENCOUNTER — Emergency Department (HOSPITAL_BASED_OUTPATIENT_CLINIC_OR_DEPARTMENT_OTHER): Payer: BC Managed Care – PPO

## 2019-10-05 ENCOUNTER — Other Ambulatory Visit: Payer: Self-pay

## 2019-10-05 ENCOUNTER — Emergency Department (HOSPITAL_BASED_OUTPATIENT_CLINIC_OR_DEPARTMENT_OTHER)
Admission: EM | Admit: 2019-10-05 | Discharge: 2019-10-05 | Disposition: A | Discharge status: Home / Self Care | Payer: BC Managed Care – PPO | Attending: Emergency Medicine | Admitting: Emergency Medicine

## 2019-10-05 ENCOUNTER — Encounter (HOSPITAL_BASED_OUTPATIENT_CLINIC_OR_DEPARTMENT_OTHER): Payer: Self-pay | Admitting: Emergency Medicine

## 2019-10-05 DIAGNOSIS — Y9289 Other specified places as the place of occurrence of the external cause: Secondary | ICD-10-CM | POA: Diagnosis not present

## 2019-10-05 DIAGNOSIS — Y999 Unspecified external cause status: Secondary | ICD-10-CM | POA: Insufficient documentation

## 2019-10-05 DIAGNOSIS — Z79899 Other long term (current) drug therapy: Secondary | ICD-10-CM | POA: Diagnosis not present

## 2019-10-05 DIAGNOSIS — S8992XA Unspecified injury of left lower leg, initial encounter: Secondary | ICD-10-CM | POA: Diagnosis present

## 2019-10-05 DIAGNOSIS — Y9352 Activity, horseback riding: Secondary | ICD-10-CM | POA: Diagnosis not present

## 2019-10-05 DIAGNOSIS — M25562 Pain in left knee: Secondary | ICD-10-CM | POA: Insufficient documentation

## 2019-10-05 MED ORDER — HYDROCODONE-ACETAMINOPHEN 5-325 MG PO TABS
0.5000 | ORAL_TABLET | Freq: Once | ORAL | Status: AC
  Administered 2019-10-05: 0.5 via ORAL
  Filled 2019-10-05: qty 1

## 2019-10-05 MED ORDER — HYDROCODONE-ACETAMINOPHEN 5-325 MG PO TABS: ORAL_TABLET | ORAL | 0 refills | Status: AC

## 2019-10-05 NOTE — ED Notes (Signed)
Pt discharged to home with family. NAD.  

## 2019-10-05 NOTE — Discharge Instructions (Signed)
Please read and follow all provided instructions.  Your diagnoses today include:  1. Acute pain of left knee     Tests performed today include:  An x-ray of the affected area - does NOT show any broken bones  Vital signs. See below for your results today.   Medications prescribed:   Vicodin (hydrocodone/acetaminophen) - narcotic pain medication  DO NOT drive or perform any activities that require you to be awake and alert because this medicine can make you drowsy. BE VERY CAREFUL not to take multiple medicines containing Tylenol (also called acetaminophen). Doing so can lead to an overdose which can damage your liver and cause liver failure and possibly death.  Take any prescribed medications only as directed.  Home care instructions:   Follow any educational materials contained in this packet  Follow R.I.C.E. Protocol:  R - rest your injury   I  - use ice on injury without applying directly to skin  C - compress injury with bandage or splint  E - elevate the injury as much as possible  Follow-up instructions: Please follow-up with your orthopedic doctor or the orthopedic referral in the next week for recheck of your knee.  Return instructions:   Please return if your toes or feet are numb or tingling, appear gray or blue, or you have severe pain (also elevate the leg and loosen splint or wrap if you were given one)  Please return to the Emergency Department if you experience worsening symptoms.   Please return if you have any other emergent concerns.  Additional Information:  Your vital signs today were: BP 110/68 (BP Location: Right Arm)   Pulse 76   Temp 98.9 F (37.2 C) (Oral)   Resp 16   Ht 5\' 6"  (1.676 m)   Wt 65.8 kg   LMP 03/20/2016   SpO2 100%   BMI 23.40 kg/m  If your blood pressure (BP) was elevated above 135/85 this visit, please have this repeated by your doctor within one month.

## 2019-10-05 NOTE — ED Triage Notes (Signed)
L knee injury yesterday. States she was thrown from a horse.

## 2019-10-05 NOTE — ED Provider Notes (Signed)
[facility] EMERGENCY DEPARTMENT Provider Note   CSN: [id number] Arrival date & time: 10/05/19  1123     History Chief Complaint  Patient presents with  . Knee Injury    Brittany Velazquez is a 54 y.o. female.  Patient presents the emergency department with acute onset of left knee pain which started yesterday when she was thrown from a horse.  She thinks that she twisted her leg when she landed.  She has had some generalized muscle pain and some pain in her left ankle but is mainly concerned about her knee today.  Pain is over the outside of the knee.  She is unable to fully flex her knee.  No treatments prior to arrival.  She is able to bear weight but with difficulty.  No hip pain.  She denies any head injury.          Past Medical History:  Diagnosis Date  . Annular tear of cervical disc 01/2014   Referred to Dr. Wynetta Emery 03/03/2014  . History of chicken pox   . History of mumps   . Hypothyroidism     Patient Active Problem List   Diagnosis Date Noted  . Pure hypercholesterolemia 10/17/2018  . Herpes zoster 04/17/2018  . Hypothyroidism 04/17/2018  . Dizziness 03/25/2018  . Palpitations 08/22/2017  . Cervical radiculopathy 06/19/2017  . Annular tear of cervical disc 10/30/2014  . Neck pain 10/30/2014  . Paresthesias 10/30/2014    Past Surgical History:  Procedure Laterality Date  . TEMPOROMANDIBULAR JOINT SURGERY  1989     OB History    Gravida  2   Para  2   Term      Preterm      AB      Living        SAB      TAB      Ectopic      Multiple      Live Births              Family History  Problem Relation Age of Onset  . Dementia Mother   . Prostate cancer Father   . Breast cancer Sister   . Heart attack Paternal Grandfather     Social History   Tobacco Use  . Smoking status: Never Smoker  . Smokeless tobacco: Never Used  Substance Use Topics  . Alcohol use: No    Alcohol/week: 0.0 standard drinks  . Drug use: No     Home Medications Prior to Admission medications   Medication Sig Start Date End Date Taking? Authorizing Provider  atorvastatin (LIPITOR) 10 MG tablet Take 10 mg by mouth daily.    [provider]  azelastine (ASTELIN) 0.1 % nasal spray Place 1 spray into both nostrils 2 (two) times daily. Use in each nostril as directed 06/28/18   Trey Sailors, PA-C  cyclobenzaprine (FLEXERIL) 5 MG tablet Take 1 tablet (5 mg total) by mouth 3 (three) times daily as needed for muscle spasms. 08/02/18   Margaretann Loveless, PA-C  levothyroxine (SYNTHROID, LEVOTHROID) 50 MCG tablet Take 50 mcg by mouth daily. 08/05/15   [provider]  meclizine (ANTIVERT) 25 MG tablet Take 1 tablet (25 mg total) by mouth 2 (two) times daily as needed for dizziness. Take it scheduled for 3 days and then as needed for dizziness 03/26/18   Milagros Loll, MD  naproxen (NAPROSYN) 500 MG tablet Take 1 tablet (500 mg total) by mouth 2 (two) times daily with  a meal. 08/02/18   Burnette, Clearnce Sorrel, PA-C  predniSONE (DELTASONE) 20 MG tablet Take 1 tablet (20 mg total) by mouth daily with breakfast. Patient taking differently: Take 20 mg by mouth as needed.  08/02/18   Mar Daring, PA-C  valACYclovir (VALTREX) 500 MG tablet Take 500 mg by mouth at bedtime.    [provider]    Allergies    Fentanyl and Codeine  Review of Systems   Review of Systems  Constitutional: Negative for activity change.  Musculoskeletal: Positive for arthralgias and myalgias. Negative for back pain, gait problem, joint swelling and neck pain.  Skin: Negative for wound.  Neurological: Negative for weakness and numbness.    Physical Exam Updated Vital Signs BP 110/68 (BP Location: Right Arm)   Pulse 76   Temp 98.9 F (37.2 C) (Oral)   Resp 16   Ht 5\' 6"  (1.676 m)   Wt 65.8 kg   LMP 03/20/2016   SpO2 100%   BMI 23.40 kg/m   Physical Exam Vitals and nursing note reviewed.  Constitutional:      Appearance:  She is well-developed.  HENT:     Head: Normocephalic and atraumatic.  Eyes:     Pupils: Pupils are equal, round, and reactive to light.  Cardiovascular:     Pulses: Normal pulses. No decreased pulses.  Musculoskeletal:        General: Tenderness present.     Cervical back: Normal range of motion and neck supple.     Left knee: No swelling, deformity or effusion. Decreased range of motion (in flexion). Tenderness present over the lateral joint line and LCL. No patellar tendon tenderness.     Left lower leg: Tenderness and bony tenderness (head of fibula) present.     Left ankle: No swelling. Tenderness present. Normal range of motion. Normal pulse.  Skin:    General: Skin is warm and dry.  Neurological:     Mental Status: She is alert.     Sensory: No sensory deficit.     Comments: Motor, sensation, and vascular distal to the injury is fully intact.      ED Results / Procedures / Treatments   Labs (all labs ordered are listed, but only abnormal results are displayed) Labs Reviewed - No data to display  EKG None  Radiology DG Knee Complete 4 Views Left  Result Date: 10/05/2019 CLINICAL DATA:  Thrown from horse yesterday with knee pain, initial encounter EXAM: LEFT KNEE - COMPLETE 4+ VIEW COMPARISON:  None. FINDINGS: No evidence of fracture, dislocation, or joint effusion. No evidence of arthropathy or other focal bone abnormality. Soft tissues are unremarkable. IMPRESSION: No acute abnormality noted. Electronically Signed   By: Inez Catalina M.D.   On: 10/05/2019 12:25    Procedures Procedures (including critical care time)  Medications Ordered in ED Medications  HYDROcodone-acetaminophen (NORCO/VICODIN) 5-325 MG per tablet 0.5 tablet (has no administration in time range)    ED Course  I have reviewed the triage vital signs and the nursing notes.  Pertinent labs & imaging results that were available during my care of the patient were reviewed by me and considered in my  medical decision making (see chart for details).  Patient seen and examined. X-ray reviewed, including at bedside to correlate with point tenderness.  Patient has tenderness over the lateral knee as well as point tenderness over the head of the fibula.  No displaced fracture noted on x-ray.  No tibial plateau tenderness.  Patient is  able to flex her knee somewhat but is limited.  Do not suspect patellar tendon or quadriceps tendon disruption.  Distal extremity is neurovascularly intact.  Vital signs reviewed and are as follows: BP 110/68 (BP Location: Right Arm)   Pulse 76   Temp 98.9 F (37.2 C) (Oral)   Resp 16   Ht 5\' 6"  (1.676 m)   Wt 65.8 kg   LMP 03/20/2016   SpO2 100%   BMI 23.40 kg/m   Will provide with crutches and knee immobilizer.  She is requesting pain medication, has an allergy to codeine.  Will give half a tablet of Vicodin.  Will give short course of pain medication for home.  Encouraged use of NSAIDs, rice protocol.  She would like to follow-up with [facility] orthopedics.  [facility] is on-call today, she is given a referral for them as well if unable to see Laura.    MDM Rules/Calculators/A&P                      Knee injury.  Cannot rule out occult fracture of the proximal tibia.  Lower extremity is neurovascularly intact.  Treatment as above.   Final Clinical Impression(s) / ED Diagnoses Final diagnoses:  Acute pain of left knee    Rx / DC Orders ED Discharge Orders         Ordered    HYDROcodone-acetaminophen (NORCO/VICODIN) 5-325 MG tablet     10/05/19 1255           10/07/19, PA-C 10/05/19 1302    10/07/19, MD 10/06/19 [phone]

## 2019-10-13 ENCOUNTER — Ambulatory Visit (HOSPITAL_COMMUNITY)
Admission: RE | Admit: 2019-10-13 | Discharge: 2019-10-13 | Disposition: A | Discharge status: Home / Self Care | Payer: BC Managed Care – PPO | Source: Ambulatory Visit | Attending: Surgery | Admitting: Surgery

## 2019-10-13 ENCOUNTER — Other Ambulatory Visit (HOSPITAL_COMMUNITY): Payer: Self-pay | Admitting: Orthopaedic Surgery

## 2019-10-13 ENCOUNTER — Other Ambulatory Visit: Payer: Self-pay

## 2019-10-13 DIAGNOSIS — M79662 Pain in left lower leg: Secondary | ICD-10-CM | POA: Diagnosis not present

## 2019-10-13 DIAGNOSIS — M7989 Other specified soft tissue disorders: Secondary | ICD-10-CM | POA: Diagnosis not present

## 2020-01-13 ENCOUNTER — Ambulatory Visit: Payer: Self-pay

## 2020-01-13 ENCOUNTER — Other Ambulatory Visit: Payer: Self-pay | Admitting: Family Medicine

## 2020-01-13 ENCOUNTER — Other Ambulatory Visit: Payer: Self-pay

## 2020-01-13 DIAGNOSIS — M79631 Pain in right forearm: Secondary | ICD-10-CM

## 2020-08-05 LAB — HM MAMMOGRAPHY

## 2020-08-05 LAB — HM PAP SMEAR: HM Pap smear: NORMAL

## 2020-08-17 ENCOUNTER — Encounter: Payer: Self-pay | Admitting: Family Medicine

## 2020-08-23 LAB — COLOGUARD: Cologuard: NEGATIVE

## 2020-08-28 LAB — COLOGUARD: COLOGUARD: NEGATIVE

## 2020-08-28 LAB — EXTERNAL GENERIC LAB PROCEDURE: COLOGUARD: NEGATIVE

## 2021-03-12 IMAGING — DX DG FOREARM 2V*R*
2 series · 2 of 2 positions shown · non-contrast
Comparison: None.

CLINICAL DATA: Pain following trauma

EXAM:
RIGHT FOREARM - 2 VIEW

[forearm ap]
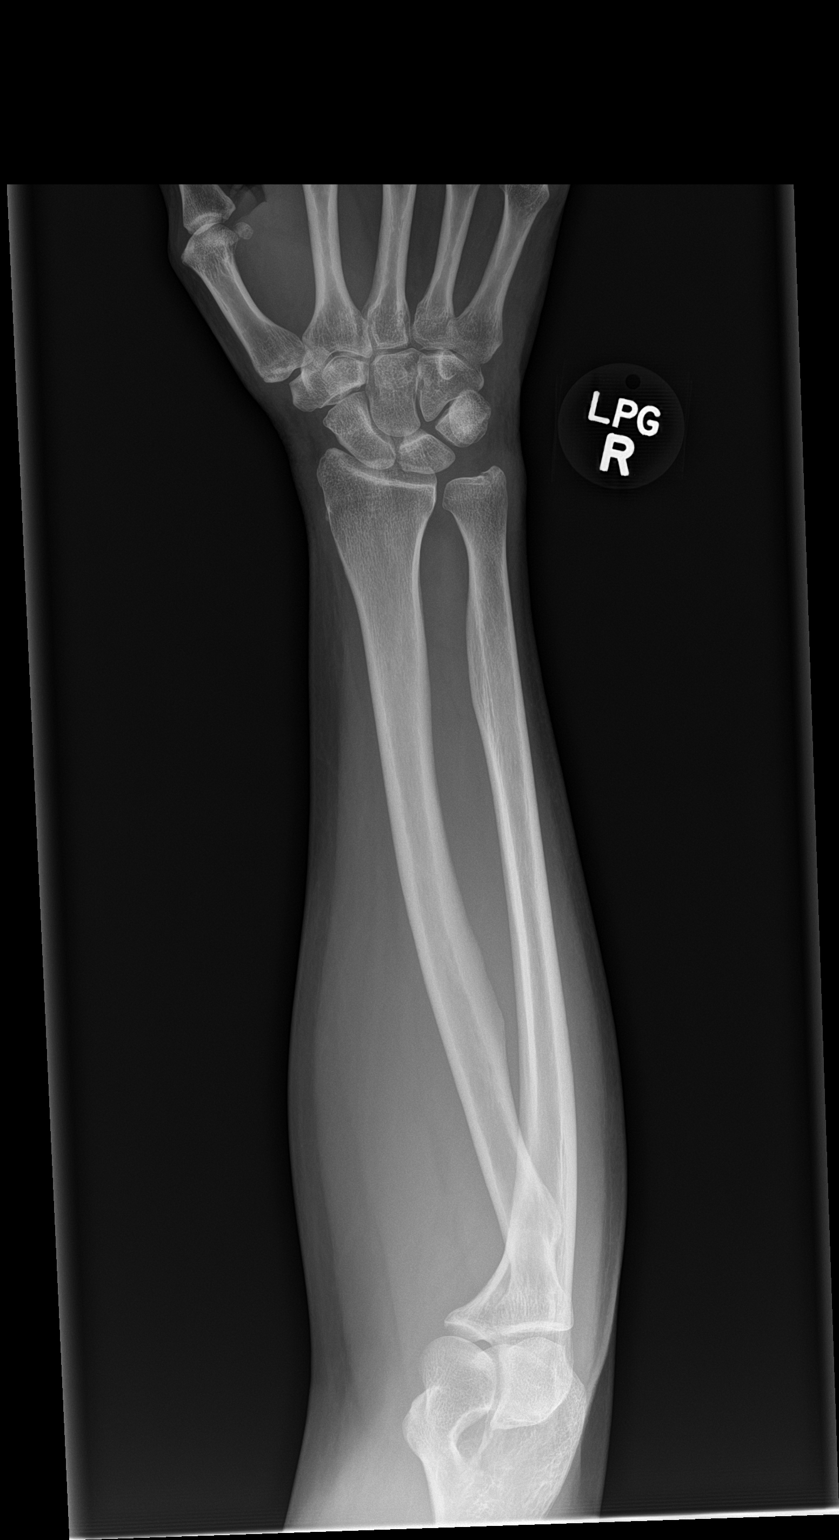

[forearm lat]
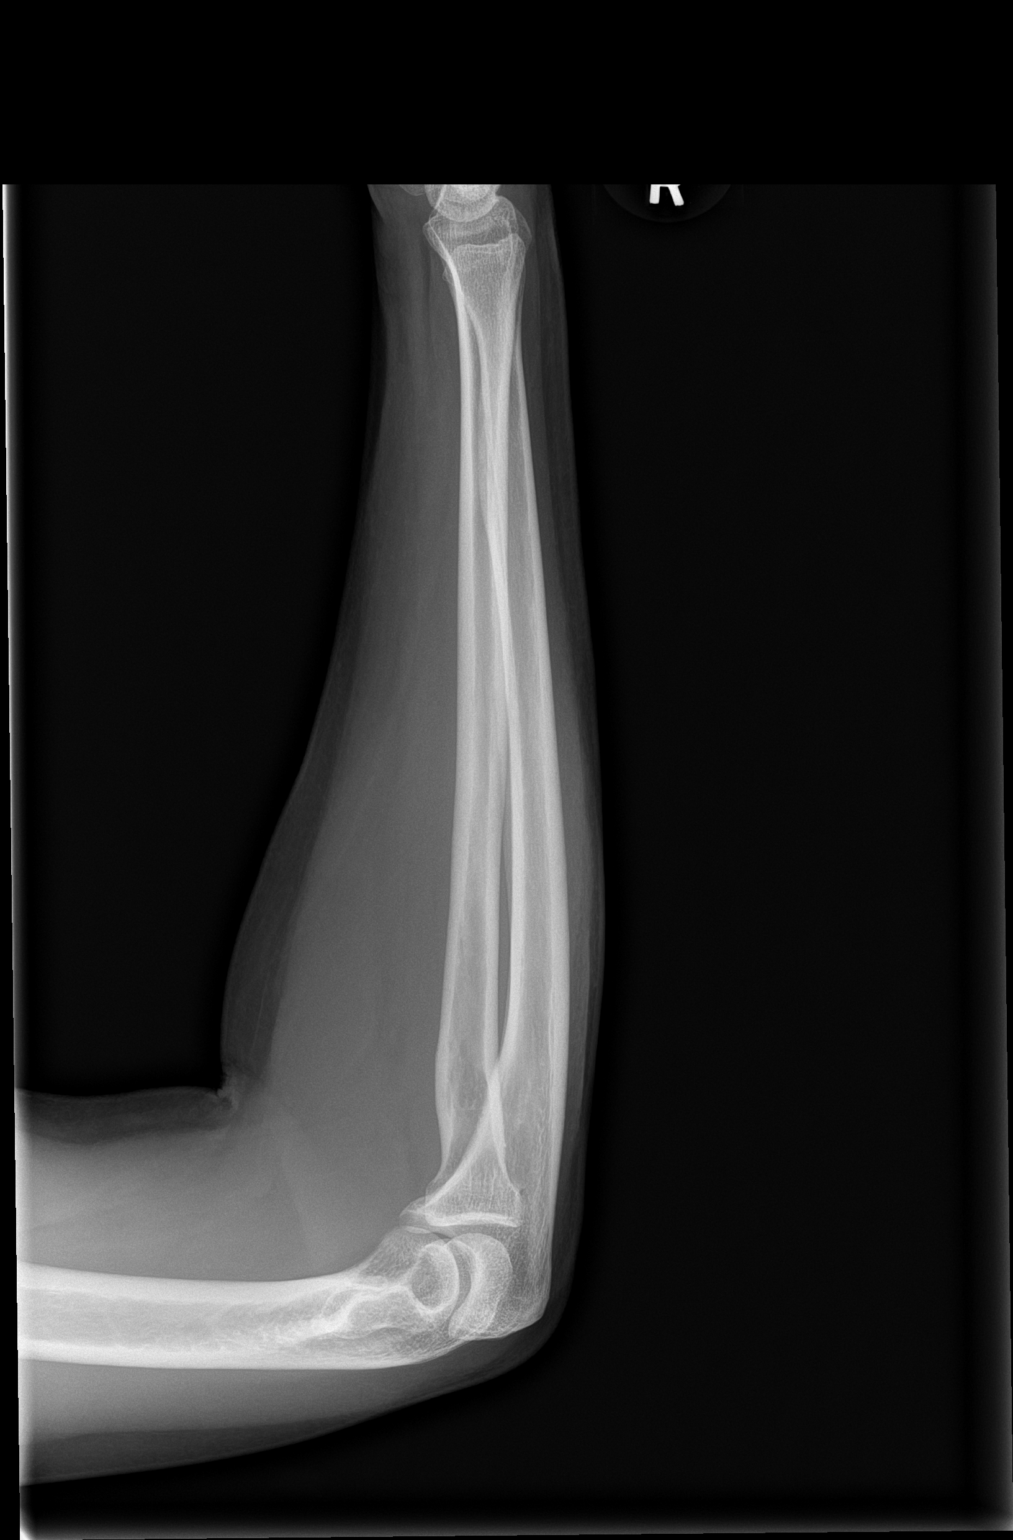

[2 of 2 positions shown; findings below may reference images not displayed]

FINDINGS: Frontal and lateral views were obtained. No fracture or dislocation.
Joint spaces appear normal. No erosive change. No abnormal
periosteal reaction.
IMPRESSION: No fracture or dislocation.  No evident arthropathy.

## 2021-09-08 LAB — HM MAMMOGRAPHY

## 2021-09-09 LAB — CBC AND DIFFERENTIAL
HCT: 43 (ref 36–46)
Hemoglobin: 14.4 (ref 12.0–16.0)
Neutrophils Absolute: 2.2
Platelets: 177 10*3/uL (ref 150–400)
WBC: 3.7

## 2021-09-09 LAB — COMPREHENSIVE METABOLIC PANEL
Albumin: 4.7 (ref 3.5–5.0)
Calcium: 9.5 (ref 8.7–10.7)
Globulin: 1.9
eGFR: 86

## 2021-09-09 LAB — BASIC METABOLIC PANEL
BUN: 16 (ref 4–21)
CO2: 25 — AB (ref 13–22)
Chloride: 104 (ref 99–108)
Creatinine: 0.8 (ref <=1.1)
Glucose: 73
Potassium: 4.4 mEq/L (ref 3.5–5.1)
Sodium: 144 (ref 137–147)

## 2021-09-09 LAB — HEMOGLOBIN A1C: Hemoglobin A1C: 5.4

## 2021-09-09 LAB — LIPID PANEL
Cholesterol: 155 (ref 0–200)
HDL: 64 (ref 35–70)
LDL Cholesterol: 79
Triglycerides: 61 (ref 40–160)

## 2021-09-09 LAB — CBC: RBC: 4.47 (ref 3.87–5.11)

## 2021-09-09 LAB — TSH: TSH: 1.42 (ref <=5.90)

## 2021-09-09 LAB — HEPATIC FUNCTION PANEL
ALT: 15 U/L (ref 7–35)
AST: 21 (ref 13–35)
Alkaline Phosphatase: 69 (ref 25–125)
Bilirubin, Total: 0.6

## 2021-10-12 ENCOUNTER — Other Ambulatory Visit: Payer: Self-pay | Admitting: Obstetrics & Gynecology

## 2021-10-12 DIAGNOSIS — N644 Mastodynia: Secondary | ICD-10-CM

## 2021-10-18 ENCOUNTER — Ambulatory Visit
Admission: RE | Admit: 2021-10-18 | Discharge: 2021-10-18 | Disposition: A | Discharge status: Home / Self Care | Payer: BC Managed Care – PPO | Source: Ambulatory Visit | Attending: Obstetrics & Gynecology | Admitting: Obstetrics & Gynecology

## 2021-10-18 ENCOUNTER — Ambulatory Visit: Payer: BC Managed Care – PPO

## 2021-10-18 DIAGNOSIS — N644 Mastodynia: Secondary | ICD-10-CM

## 2022-09-19 LAB — CBC: RBC: 4.59 (ref 3.87–5.11)

## 2022-09-19 LAB — LIPID PANEL
Cholesterol: 186 (ref 0–200)
HDL: 68 (ref 35–70)
LDL Cholesterol: 101
Triglycerides: 93 (ref 40–160)

## 2022-09-19 LAB — BASIC METABOLIC PANEL
BUN: 14 (ref 4–21)
CO2: 23 — AB (ref 13–22)
Chloride: 99 (ref 99–108)
Creatinine: 0.7 (ref 0.5–1.1)
Glucose: 120
Potassium: 4.4 mEq/L (ref 3.5–5.1)
Sodium: 139 (ref 137–147)

## 2022-09-19 LAB — COMPREHENSIVE METABOLIC PANEL
Albumin: 4.7 (ref 3.5–5.0)
Calcium: 9.5 (ref 8.7–10.7)
Globulin: 2.4
eGFR: 96

## 2022-09-19 LAB — HEPATIC FUNCTION PANEL
ALT: 19 U/L (ref 7–35)
AST: 26 (ref 13–35)
Alkaline Phosphatase: 75 (ref 25–125)
Bilirubin, Total: 0.5

## 2022-09-19 LAB — CBC AND DIFFERENTIAL
HCT: 45 (ref 36–46)
Hemoglobin: 15.2 (ref 12.0–16.0)
Neutrophils Absolute: 6.2
Platelets: 199 10*3/uL (ref 150–400)
WBC: 8.1

## 2022-09-19 LAB — TSH: TSH: 1.08 (ref 0.41–5.90)

## 2022-09-19 LAB — HEMOGLOBIN A1C: Hemoglobin A1C: 5.4

## 2022-09-21 ENCOUNTER — Encounter: Payer: Self-pay | Admitting: Family Medicine

## 2022-09-25 LAB — HM MAMMOGRAPHY

## 2022-09-26 ENCOUNTER — Encounter: Payer: Self-pay | Admitting: Family Medicine

## 2022-09-27 ENCOUNTER — Encounter: Payer: Self-pay | Admitting: Family Medicine

## 2023-10-01 LAB — HM MAMMOGRAPHY

## 2023-10-02 LAB — LAB REPORT - SCANNED
A1c: 5.4
EGFR (Non-African Amer.): 76
TSH: 1.72 (ref 0.41–5.90)

## 2023-10-03 LAB — HM PAP SMEAR: HPV, high-risk: NEGATIVE

## 2024-04-24 ENCOUNTER — Ambulatory Visit: Payer: Self-pay | Admitting: *Deleted

## 2024-04-24 NOTE — Telephone Encounter (Signed)
 Noted. Has not scheduled a new OV appt

## 2024-04-24 NOTE — Telephone Encounter (Signed)
 FYI Only or Action Required?: Action required by provider: request for appointment.  Patient was last seen in primary care on 2020.  Called Nurse Triage reporting Chest Injury.  Symptoms began several weeks ago.  Interventions attempted: Ice/heat application.  Symptoms are: some improvement- but has lingering symptoms  Triage Disposition: See PCP When Office is Open (Within 3 Days)  Patient/caregiver understands and will follow disposition?: Yes- UC advised   Patient has not been seen in office since 2020- she would like to reestablish with Dr Myrla. Patient advised UC for immediate concerns and will send message to provider to see if it is possible to schedule her with previous provider.   Copied from CRM #[id number]. Topic: Clinical - Red Word Triage >> Apr 24, 2024  9:16 AM Harlene ORN wrote: Red Word that prompted transfer to Nurse Triage: was in an accident a couple weeks ago believes she might have broken ribs has not gone to a hospital/urgent care Reason for Disposition  [1] After 3 days AND [2] chest pain not improved  Answer Assessment - Initial Assessment Questions 1. MECHANISM: How did the injury happen?     Patient fell and injured hip and chest- landed on huge rock- beltline 2. ONSET: When did the injury happen? (.e.g., minutes, hours, days ago)     3 weeks ago 3. LOCATION: Where on the chest is the injury located?     Back/rip- R- more on the side to back 4. APPEARANCE: What does the injury look like?     no 5. BLEEDING: Is there any bleeding now? If Yes, ask: How long has it been bleeding?     no 6. SEVERITY: Any difficulty with breathing?     Inhaling large breaths is painful- normal breathing is better 7. SIZE: For cuts, bruises, or swelling, ask: How large is it? (e.g., inches or centimeters)     Patient did have more significant bruising - clearing  8. PAIN: Is there pain? If Yes, ask: How bad is the pain? (e.g., Scale 0-10; none, mild,  moderate, severe)     Movement caused minimal pain, 3/10, lifting is difficult  Protocols used: Chest Injury-A-AH

## 2024-06-30 ENCOUNTER — Ambulatory Visit: Admitting: Podiatry
